# Patient Record
Sex: Female | Born: 1995 | Hispanic: Yes | Marital: Single | Smoking: Never smoker
Health system: Southern US, Community
[De-identification: ages and names within clinical notes are randomized; demographics above are authoritative.]

## PROBLEM LIST (undated history)

## (undated) ENCOUNTER — Inpatient Hospital Stay: Payer: Self-pay

## (undated) DIAGNOSIS — R7303 Prediabetes: Secondary | ICD-10-CM

## (undated) HISTORY — PX: WISDOM TOOTH EXTRACTION: SHX21

---

## 2019-12-28 DIAGNOSIS — U071 COVID-19: Secondary | ICD-10-CM

## 2019-12-28 HISTORY — DX: COVID-19: U07.1

## 2020-05-22 ENCOUNTER — Other Ambulatory Visit: Payer: Self-pay | Admitting: Gastroenterology

## 2020-05-22 DIAGNOSIS — R945 Abnormal results of liver function studies: Secondary | ICD-10-CM

## 2020-05-22 DIAGNOSIS — R7989 Other specified abnormal findings of blood chemistry: Secondary | ICD-10-CM

## 2020-05-24 ENCOUNTER — Other Ambulatory Visit: Payer: Self-pay

## 2020-05-24 ENCOUNTER — Ambulatory Visit
Admission: RE | Admit: 2020-05-24 | Discharge: 2020-05-24 | Disposition: A | Discharge status: Home / Self Care | Payer: Commercial Managed Care - PPO | Source: Ambulatory Visit | Attending: Gastroenterology | Admitting: Gastroenterology

## 2020-05-24 DIAGNOSIS — R7989 Other specified abnormal findings of blood chemistry: Secondary | ICD-10-CM

## 2020-05-24 DIAGNOSIS — R945 Abnormal results of liver function studies: Secondary | ICD-10-CM

## 2020-12-27 DIAGNOSIS — U071 COVID-19: Secondary | ICD-10-CM

## 2020-12-27 HISTORY — DX: COVID-19: U07.1

## 2021-03-25 ENCOUNTER — Encounter (HOSPITAL_COMMUNITY): Payer: Self-pay | Admitting: Orthopedic Surgery

## 2021-03-25 ENCOUNTER — Other Ambulatory Visit: Payer: Self-pay

## 2021-03-25 NOTE — Progress Notes (Signed)
Spoke with pt for pre-op call. Pt denies cardiac history. Pt states she is pre-diabetic. States she does not know what her last A1C was. States she was prescribed Metformin but did not take it. I have requested most recent A1C from Wallie Renshaw, NP office.   Covid test on arrival.

## 2021-03-25 NOTE — H&P (Signed)
Orthopaedic Trauma Service (OTS) Consult   Patient ID: Savannah Hoffman MRN: [id number] DOB/AGE: 1996/01/10 26 y.o.   HPI: Savannah Hoffman is a 26 y.o. female who sustained a L tibial plateau fracture on 02/23/2021 due to a ground level fall. She was initially seen and [facility] and then referred to OTS for definitive management. Pt first seen on 03/06/2021, at that visit her fracture was well aligned and she wished to pursue non-op management.  Upon second follow up visit it was noted that her fracture had shifted and surgery was encouraged. Risks and benefits of surgery reviewed with pt and she wishes to proceed   Past Medical History:  Diagnosis Date   COVID 12/2020   and also 2021 - no symptoms, 2022 was a mild case   Pre-diabetes     Past Surgical History:  Procedure Laterality Date   WISDOM TOOTH EXTRACTION      History reviewed. No pertinent family history.  Social History:  reports that she has never smoked. She has never used smokeless tobacco. She reports that she does not currently use alcohol. She reports that she does not use drugs.  Allergies: No Known Allergies  Medications: I have reviewed the patient's current medications. Current Meds  Medication Sig   acetaminophen (TYLENOL) 500 MG tablet Take 500 mg by mouth every 6 (six) hours as needed.   oxyCODONE-acetaminophen (PERCOCET) 5-325 MG tablet Take 1 tablet by mouth every 6 (six) hours as needed for severe pain.     No results found for this or any previous visit (from the past 48 hour(s)).  No results found.  Intake/Output    None      Review of Systems  Constitutional:  Negative for chills and fever.  Eyes:  Negative for blurred vision.  Respiratory:  Negative for shortness of breath.   Cardiovascular:  Negative for chest pain and palpitations.  Gastrointestinal:  Negative for abdominal pain, nausea and vomiting.  Genitourinary:  Negative for dysuria.  Neurological:  Negative for tingling  and sensory change.  Last menstrual period 02/26/2021. Physical Exam Constitutional:      General: She is not in acute distress.    Appearance: Normal appearance.  HENT:     Head: Normocephalic and atraumatic.  Eyes:     Extraocular Movements: Extraocular movements intact.  Cardiovascular:     Rate and Rhythm: Normal rate.  Pulmonary:     Effort: Pulmonary effort is normal.  Musculoskeletal:     Comments: Left Lower Extremity  No traumatic wounds to left knee or lower leg Ext warm  Minimal swelling  Distal motor and sensory functions intact No DCT Compartments are soft No pain with passive stretch    Neurological:     Mental Status: She is alert.     Assessment/Plan:  26 y/o female s/p fall with left tibial plateau fracture   - fall  -closed left tibial plateau fracture  OR for ORIF   NWB X 6 weeks  Unrestricted ROM L knee in hinged brace  PT/OT postop   Admit post op for pain control and therapies   - Pain management:  Multimodal  - Medical issues   ? Hx of pre-diabetes   Apparently prescribed metformin but not taking    Check a1c  - DVT/PE prophylaxis:  TBD post op    ASA vs DOAC - ID:   Periop abx  - Metabolic Bone Disease:  Check vitamin d   - Activity:  As above  - FEN/GI prophylaxis/Foley/Lines:  Npo   Advance diet post op   - Dispo:  OR for ORIF Left tibial plateau      Mearl Latin, PA-C [phone] (C) 03/25/2021, 1:50 PM  [facility] [address] [place] [address] [phone] [phone] (F)    After 5pm and on the weekends please log on to Amion, go to orthopaedics and the look under the [facility] Call for the provider(s) on call. You can also call our office at [phone] and then follow the prompts to be connected to the call team.

## 2021-03-26 ENCOUNTER — Ambulatory Visit (HOSPITAL_COMMUNITY): Payer: Commercial Managed Care - PPO | Admitting: Anesthesiology

## 2021-03-26 ENCOUNTER — Ambulatory Visit (HOSPITAL_COMMUNITY): Payer: Commercial Managed Care - PPO

## 2021-03-26 ENCOUNTER — Encounter (HOSPITAL_COMMUNITY)
Admission: RE | Disposition: A | Discharge status: Home / Self Care | Payer: Self-pay | Source: Home / Self Care | Attending: Orthopedic Surgery

## 2021-03-26 ENCOUNTER — Inpatient Hospital Stay (HOSPITAL_COMMUNITY): Payer: Commercial Managed Care - PPO

## 2021-03-26 ENCOUNTER — Other Ambulatory Visit: Payer: Self-pay

## 2021-03-26 ENCOUNTER — Inpatient Hospital Stay (HOSPITAL_COMMUNITY)
Admission: RE | Admit: 2021-03-26 | Discharge: 2021-03-27 | DRG: 493 | Disposition: A | Discharge status: Home / Self Care | Payer: Commercial Managed Care - PPO | Attending: Orthopedic Surgery | Admitting: Orthopedic Surgery

## 2021-03-26 ENCOUNTER — Encounter (HOSPITAL_COMMUNITY): Payer: Self-pay | Admitting: Orthopedic Surgery

## 2021-03-26 DIAGNOSIS — D62 Acute posthemorrhagic anemia: Secondary | ICD-10-CM | POA: Diagnosis present

## 2021-03-26 DIAGNOSIS — Z419 Encounter for procedure for purposes other than remedying health state, unspecified: Secondary | ICD-10-CM

## 2021-03-26 DIAGNOSIS — S82142A Displaced bicondylar fracture of left tibia, initial encounter for closed fracture: Secondary | ICD-10-CM

## 2021-03-26 DIAGNOSIS — Z8616 Personal history of COVID-19: Secondary | ICD-10-CM | POA: Diagnosis not present

## 2021-03-26 DIAGNOSIS — S82122A Displaced fracture of lateral condyle of left tibia, initial encounter for closed fracture: Principal | ICD-10-CM

## 2021-03-26 DIAGNOSIS — E559 Vitamin D deficiency, unspecified: Secondary | ICD-10-CM | POA: Diagnosis present

## 2021-03-26 DIAGNOSIS — W1830XA Fall on same level, unspecified, initial encounter: Secondary | ICD-10-CM | POA: Diagnosis present

## 2021-03-26 DIAGNOSIS — T148XXA Other injury of unspecified body region, initial encounter: Secondary | ICD-10-CM

## 2021-03-26 HISTORY — DX: Prediabetes: R73.03

## 2021-03-26 HISTORY — PX: ORIF TIBIA PLATEAU: SHX2132

## 2021-03-26 LAB — COMPREHENSIVE METABOLIC PANEL
ALT: 28 U/L (ref 0–44)
AST: 40 U/L (ref 15–41)
Albumin: 4 g/dL (ref 3.5–5.0)
Alkaline Phosphatase: 49 U/L (ref 38–126)
Anion gap: 8 (ref 5–15)
BUN: 8 mg/dL (ref 6–20)
CO2: 23 mmol/L (ref 22–32)
Calcium: 9.1 mg/dL (ref 8.9–10.3)
Chloride: 107 mmol/L (ref 98–111)
Creatinine, Ser: 0.67 mg/dL (ref 0.44–1.00)
GFR, Estimated: >60 mL/min (ref >60)
Glucose, Bld: 94 mg/dL (ref 70–99)
Potassium: 3.6 mmol/L (ref 3.5–5.1)
Sodium: 138 mmol/L (ref 135–145)
Total Bilirubin: 0.5 mg/dL (ref 0.3–1.2)
Total Protein: 7.9 g/dL (ref 6.5–8.1)

## 2021-03-26 LAB — SARS CORONAVIRUS 2 BY RT PCR (HOSPITAL ORDER, PERFORMED IN ~~LOC~~ HOSPITAL LAB): SARS Coronavirus 2: NEGATIVE

## 2021-03-26 LAB — CBC WITH DIFFERENTIAL/PLATELET
Abs Immature Granulocytes: 0.02 10*3/uL (ref 0.00–0.07)
Basophils Absolute: 0 10*3/uL (ref 0.0–0.1)
Basophils Relative: 1 %
Eosinophils Absolute: 0.1 10*3/uL (ref 0.0–0.5)
Eosinophils Relative: 1 %
HCT: 34.9 % — ABNORMAL LOW (ref 36.0–46.0)
Hemoglobin: 11.5 g/dL — ABNORMAL LOW (ref 12.0–15.0)
Immature Granulocytes: 0 %
Lymphocytes Relative: 35 %
Lymphs Abs: 2.7 10*3/uL (ref 0.7–4.0)
MCH: 27.2 pg (ref 26.0–34.0)
MCHC: 33 g/dL (ref 30.0–36.0)
MCV: 82.5 fL (ref 80.0–100.0)
Monocytes Absolute: 0.6 10*3/uL (ref 0.1–1.0)
Monocytes Relative: 8 %
Neutro Abs: 4.1 10*3/uL (ref 1.7–7.7)
Neutrophils Relative %: 55 %
Platelets: 295 10*3/uL (ref 150–400)
RBC: 4.23 MIL/uL (ref 3.87–5.11)
RDW: 13.8 % (ref 11.5–15.5)
WBC: 7.6 10*3/uL (ref 4.0–10.5)
nRBC: 0 % (ref 0.0–0.2)

## 2021-03-26 LAB — HEMOGLOBIN A1C
Hgb A1c MFr Bld: 5.5 % (ref 4.8–5.6)
Mean Plasma Glucose: 111.15 mg/dL

## 2021-03-26 LAB — URINALYSIS, ROUTINE W REFLEX MICROSCOPIC
Bilirubin Urine: NEGATIVE
Glucose, UA: NEGATIVE mg/dL
Hgb urine dipstick: NEGATIVE
Ketones, ur: NEGATIVE mg/dL
Leukocytes,Ua: NEGATIVE
Nitrite: NEGATIVE
Protein, ur: NEGATIVE mg/dL
Specific Gravity, Urine: 1.029 (ref 1.005–1.030)
pH: 5 (ref 5.0–8.0)

## 2021-03-26 LAB — POCT PREGNANCY, URINE: Preg Test, Ur: NEGATIVE

## 2021-03-26 LAB — VITAMIN D 25 HYDROXY (VIT D DEFICIENCY, FRACTURES): Vit D, 25-Hydroxy: 9.91 ng/mL — ABNORMAL LOW (ref 30–100)

## 2021-03-26 LAB — PROTIME-INR
INR: 0.9 (ref 0.8–1.2)
Prothrombin Time: 12.5 seconds (ref 11.4–15.2)

## 2021-03-26 SURGERY — OPEN REDUCTION INTERNAL FIXATION (ORIF) TIBIAL PLATEAU
Anesthesia: General | Laterality: Left

## 2021-03-26 MED ORDER — OXYCODONE HCL 5 MG/5ML PO SOLN: 5.0000 mg | Freq: Once | ORAL | Status: DC | PRN

## 2021-03-26 MED ORDER — FENTANYL CITRATE (PF) 100 MCG/2ML IJ SOLN
INTRAMUSCULAR | Status: AC
  Filled 2021-03-26: qty 2

## 2021-03-26 MED ORDER — OXYCODONE HCL 5 MG PO TABS
5.0000 mg | ORAL_TABLET | ORAL | Status: DC | PRN
  Administered 2021-03-27: 10 mg via ORAL
  Filled 2021-03-26 (×3): qty 2

## 2021-03-26 MED ORDER — SUGAMMADEX SODIUM 200 MG/2ML IV SOLN
INTRAVENOUS | Status: DC | PRN
  Administered 2021-03-26: 200 mg via INTRAVENOUS

## 2021-03-26 MED ORDER — PROPOFOL 10 MG/ML IV BOLUS
INTRAVENOUS | Status: DC | PRN
  Administered 2021-03-26: 120 mg via INTRAVENOUS

## 2021-03-26 MED ORDER — HYDROMORPHONE HCL 1 MG/ML IJ SOLN
0.5000 mg | INTRAMUSCULAR | Status: DC | PRN
  Administered 2021-03-26: 1 mg via INTRAVENOUS
  Filled 2021-03-26 (×2): qty 1

## 2021-03-26 MED ORDER — ONDANSETRON HCL 4 MG PO TABS: 4.0000 mg | ORAL_TABLET | Freq: Four times a day (QID) | ORAL | Status: DC | PRN

## 2021-03-26 MED ORDER — FENTANYL CITRATE (PF) 250 MCG/5ML IJ SOLN
INTRAMUSCULAR | Status: AC
  Filled 2021-03-26: qty 5

## 2021-03-26 MED ORDER — SENNOSIDES-DOCUSATE SODIUM 8.6-50 MG PO TABS: 1.0000 | ORAL_TABLET | Freq: Every evening | ORAL | Status: DC | PRN

## 2021-03-26 MED ORDER — GABAPENTIN 300 MG PO CAPS
300.0000 mg | ORAL_CAPSULE | Freq: Once | ORAL | Status: AC
  Administered 2021-03-26: 300 mg via ORAL
  Filled 2021-03-26: qty 1

## 2021-03-26 MED ORDER — METOCLOPRAMIDE HCL 5 MG PO TABS: 5.0000 mg | ORAL_TABLET | Freq: Three times a day (TID) | ORAL | Status: DC | PRN

## 2021-03-26 MED ORDER — ENOXAPARIN SODIUM 40 MG/0.4ML IJ SOSY
40.0000 mg | PREFILLED_SYRINGE | INTRAMUSCULAR | Status: DC
  Administered 2021-03-27: 40 mg via SUBCUTANEOUS
  Filled 2021-03-26: qty 0.4

## 2021-03-26 MED ORDER — VANCOMYCIN HCL 1000 MG IV SOLR
INTRAVENOUS | Status: AC
  Filled 2021-03-26: qty 20

## 2021-03-26 MED ORDER — BISACODYL 5 MG PO TBEC: 5.0000 mg | DELAYED_RELEASE_TABLET | Freq: Every day | ORAL | Status: DC | PRN

## 2021-03-26 MED ORDER — DEXAMETHASONE SODIUM PHOSPHATE 10 MG/ML IJ SOLN
INTRAMUSCULAR | Status: AC
  Filled 2021-03-26: qty 1

## 2021-03-26 MED ORDER — POTASSIUM CHLORIDE IN NACL 20-0.9 MEQ/L-% IV SOLN
INTRAVENOUS | Status: DC
  Filled 2021-03-26: qty 1000

## 2021-03-26 MED ORDER — ONDANSETRON HCL 4 MG/2ML IJ SOLN: 4.0000 mg | Freq: Four times a day (QID) | INTRAMUSCULAR | Status: DC | PRN

## 2021-03-26 MED ORDER — ACETAMINOPHEN 500 MG PO TABS: 1000.0000 mg | ORAL_TABLET | Freq: Once | ORAL | Status: DC | PRN

## 2021-03-26 MED ORDER — ACETAMINOPHEN 160 MG/5ML PO SOLN: 1000.0000 mg | Freq: Once | ORAL | Status: DC | PRN

## 2021-03-26 MED ORDER — METHOCARBAMOL 750 MG PO TABS
750.0000 mg | ORAL_TABLET | Freq: Four times a day (QID) | ORAL | Status: DC
Start: 2021-03-26 — End: 2021-03-27
  Administered 2021-03-26 – 2021-03-27 (×4): 750 mg via ORAL
  Filled 2021-03-26 (×4): qty 1

## 2021-03-26 MED ORDER — OXYCODONE HCL 5 MG PO TABS: 5.0000 mg | ORAL_TABLET | Freq: Once | ORAL | Status: DC | PRN

## 2021-03-26 MED ORDER — DEXAMETHASONE SODIUM PHOSPHATE 10 MG/ML IJ SOLN
INTRAMUSCULAR | Status: DC | PRN
  Administered 2021-03-26: 10 mg via INTRAVENOUS

## 2021-03-26 MED ORDER — LACTATED RINGERS IV SOLN: INTRAVENOUS | Status: DC

## 2021-03-26 MED ORDER — MIDAZOLAM HCL 2 MG/2ML IJ SOLN
INTRAMUSCULAR | Status: AC
  Filled 2021-03-26: qty 2

## 2021-03-26 MED ORDER — CHLORHEXIDINE GLUCONATE 0.12 % MT SOLN
15.0000 mL | Freq: Once | OROMUCOSAL | Status: AC
  Administered 2021-03-26: 15 mL via OROMUCOSAL
  Filled 2021-03-26: qty 15

## 2021-03-26 MED ORDER — ACETAMINOPHEN 10 MG/ML IV SOLN: 1000.0000 mg | Freq: Once | INTRAVENOUS | Status: DC | PRN

## 2021-03-26 MED ORDER — METHOCARBAMOL 1000 MG/10ML IJ SOLN
500.0000 mg | Freq: Four times a day (QID) | INTRAVENOUS | Status: DC | PRN
  Filled 2021-03-26: qty 5

## 2021-03-26 MED ORDER — CEFAZOLIN SODIUM-DEXTROSE 2-4 GM/100ML-% IV SOLN
2.0000 g | INTRAVENOUS | Status: AC
  Administered 2021-03-26: 2 g via INTRAVENOUS
  Filled 2021-03-26: qty 100

## 2021-03-26 MED ORDER — ACETAMINOPHEN 10 MG/ML IV SOLN
INTRAVENOUS | Status: DC | PRN
  Administered 2021-03-26: 1000 mg via INTRAVENOUS

## 2021-03-26 MED ORDER — METHOCARBAMOL 1000 MG/10ML IJ SOLN: 500.0000 mg | Freq: Four times a day (QID) | INTRAVENOUS | Status: DC

## 2021-03-26 MED ORDER — ACETAMINOPHEN 500 MG PO TABS
1000.0000 mg | ORAL_TABLET | Freq: Three times a day (TID) | ORAL | Status: DC
  Administered 2021-03-26 – 2021-03-27 (×2): 1000 mg via ORAL
  Filled 2021-03-26 (×3): qty 2

## 2021-03-26 MED ORDER — DEXMEDETOMIDINE (PRECEDEX) IN NS 20 MCG/5ML (4 MCG/ML) IV SYRINGE
PREFILLED_SYRINGE | INTRAVENOUS | Status: AC
  Filled 2021-03-26: qty 5

## 2021-03-26 MED ORDER — ORAL CARE MOUTH RINSE: 15.0000 mL | Freq: Once | OROMUCOSAL | Status: AC

## 2021-03-26 MED ORDER — ONDANSETRON HCL 4 MG/2ML IJ SOLN
INTRAMUSCULAR | Status: DC | PRN
  Administered 2021-03-26: 4 mg via INTRAVENOUS

## 2021-03-26 MED ORDER — ONDANSETRON HCL 4 MG/2ML IJ SOLN
INTRAMUSCULAR | Status: AC
  Filled 2021-03-26: qty 2

## 2021-03-26 MED ORDER — 0.9 % SODIUM CHLORIDE (POUR BTL) OPTIME
TOPICAL | Status: DC | PRN
  Administered 2021-03-26: 1000 mL

## 2021-03-26 MED ORDER — PROPOFOL 10 MG/ML IV BOLUS
INTRAVENOUS | Status: AC
  Filled 2021-03-26: qty 20

## 2021-03-26 MED ORDER — LIDOCAINE 2% (20 MG/ML) 5 ML SYRINGE
INTRAMUSCULAR | Status: DC | PRN
  Administered 2021-03-26: 60 mg via INTRAVENOUS

## 2021-03-26 MED ORDER — METOCLOPRAMIDE HCL 5 MG/ML IJ SOLN: 5.0000 mg | Freq: Three times a day (TID) | INTRAMUSCULAR | Status: DC | PRN

## 2021-03-26 MED ORDER — ROCURONIUM BROMIDE 10 MG/ML (PF) SYRINGE
PREFILLED_SYRINGE | INTRAVENOUS | Status: DC | PRN
  Administered 2021-03-26: 30 mg via INTRAVENOUS
  Administered 2021-03-26: 70 mg via INTRAVENOUS

## 2021-03-26 MED ORDER — MELOXICAM 7.5 MG PO TABS
15.0000 mg | ORAL_TABLET | Freq: Once | ORAL | Status: AC
  Administered 2021-03-26: 15 mg via ORAL
  Filled 2021-03-26: qty 2

## 2021-03-26 MED ORDER — DOCUSATE SODIUM 100 MG PO CAPS
100.0000 mg | ORAL_CAPSULE | Freq: Two times a day (BID) | ORAL | Status: DC
  Administered 2021-03-27: 100 mg via ORAL
  Filled 2021-03-26 (×2): qty 1

## 2021-03-26 MED ORDER — FENTANYL CITRATE (PF) 100 MCG/2ML IJ SOLN
25.0000 ug | INTRAMUSCULAR | Status: DC | PRN
  Administered 2021-03-26 (×3): 50 ug via INTRAVENOUS

## 2021-03-26 MED ORDER — DEXMEDETOMIDINE (PRECEDEX) IN NS 20 MCG/5ML (4 MCG/ML) IV SYRINGE
PREFILLED_SYRINGE | INTRAVENOUS | Status: DC | PRN
  Administered 2021-03-26 (×3): 4 ug via INTRAVENOUS

## 2021-03-26 MED ORDER — ROCURONIUM BROMIDE 10 MG/ML (PF) SYRINGE
PREFILLED_SYRINGE | INTRAVENOUS | Status: AC
  Filled 2021-03-26: qty 10

## 2021-03-26 MED ORDER — CEFAZOLIN SODIUM-DEXTROSE 1-4 GM/50ML-% IV SOLN
1.0000 g | Freq: Four times a day (QID) | INTRAVENOUS | Status: AC
  Administered 2021-03-26 – 2021-03-27 (×3): 1 g via INTRAVENOUS
  Filled 2021-03-26 (×3): qty 50

## 2021-03-26 MED ORDER — OXYCODONE HCL 5 MG PO TABS
10.0000 mg | ORAL_TABLET | ORAL | Status: DC | PRN
  Administered 2021-03-26 – 2021-03-27 (×3): 10 mg via ORAL
  Filled 2021-03-26: qty 2

## 2021-03-26 MED ORDER — FENTANYL CITRATE (PF) 250 MCG/5ML IJ SOLN
INTRAMUSCULAR | Status: DC | PRN
  Administered 2021-03-26: 50 ug via INTRAVENOUS
  Administered 2021-03-26 (×3): 25 ug via INTRAVENOUS
  Administered 2021-03-26: 100 ug via INTRAVENOUS

## 2021-03-26 MED ORDER — LIDOCAINE 2% (20 MG/ML) 5 ML SYRINGE
INTRAMUSCULAR | Status: AC
  Filled 2021-03-26: qty 5

## 2021-03-26 MED ORDER — ACETAMINOPHEN 325 MG PO TABS: 325.0000 mg | ORAL_TABLET | Freq: Four times a day (QID) | ORAL | Status: DC | PRN

## 2021-03-26 MED ORDER — ACETAMINOPHEN 10 MG/ML IV SOLN
INTRAVENOUS | Status: AC
  Filled 2021-03-26: qty 100

## 2021-03-26 MED ORDER — MIDAZOLAM HCL 2 MG/2ML IJ SOLN
INTRAMUSCULAR | Status: DC | PRN
Start: 2021-03-26 — End: 2021-03-26
  Administered 2021-03-26: 2 mg via INTRAVENOUS

## 2021-03-26 SURGICAL SUPPLY — 80 items
BAG COUNTER SPONGE SURGICOUNT (BAG) ×2 IMPLANT
BIT DRILL CAL (BIT) IMPLANT
BLADE CLIPPER SURG (BLADE) IMPLANT
BLADE SURG 10 STRL SS (BLADE) ×2 IMPLANT
BLADE SURG 15 STRL LF DISP TIS (BLADE) ×1 IMPLANT
BLADE SURG 15 STRL SS (BLADE) ×1
BNDG COHESIVE 4X5 TAN STRL (GAUZE/BANDAGES/DRESSINGS) ×2 IMPLANT
BNDG ELASTIC 4X5.8 VLCR STR LF (GAUZE/BANDAGES/DRESSINGS) ×1 IMPLANT
BNDG ELASTIC 6X5.8 VLCR STR LF (GAUZE/BANDAGES/DRESSINGS) ×1 IMPLANT
BNDG GAUZE ELAST 4 BULKY (GAUZE/BANDAGES/DRESSINGS) ×1 IMPLANT
BONE CANC CHIPS 20CC PCAN1/4 (Bone Implant) ×2 IMPLANT
BRUSH SCRUB EZ PLAIN DRY (MISCELLANEOUS) ×4 IMPLANT
CANISTER SUCT 3000ML PPV (MISCELLANEOUS) ×2 IMPLANT
CANISTER WOUND CARE 500ML ATS (WOUND CARE) IMPLANT
CHIPS CANC BONE 20CC PCAN1/4 (Bone Implant) ×1 IMPLANT
COVER SURGICAL LIGHT HANDLE (MISCELLANEOUS) ×4 IMPLANT
DRAPE C-ARM 42X72 X-RAY (DRAPES) ×2 IMPLANT
DRAPE C-ARMOR (DRAPES) ×2 IMPLANT
DRAPE HALF SHEET 40X57 (DRAPES) IMPLANT
DRAPE INCISE IOBAN 66X45 STRL (DRAPES) ×2 IMPLANT
DRAPE ORTHO SPLIT 77X108 STRL (DRAPES) ×2
DRAPE SURG ORHT 6 SPLT 77X108 (DRAPES) ×2 IMPLANT
DRAPE U-SHAPE 47X51 STRL (DRAPES) ×2 IMPLANT
DRILL BIT CAL (BIT) ×2
DRSG ADAPTIC 3X8 NADH LF (GAUZE/BANDAGES/DRESSINGS) ×1 IMPLANT
ELECT REM PT RETURN 9FT ADLT (ELECTROSURGICAL) ×2
ELECTRODE REM PT RTRN 9FT ADLT (ELECTROSURGICAL) ×1 IMPLANT
GAUZE SPONGE 4X4 12PLY STRL (GAUZE/BANDAGES/DRESSINGS) ×1 IMPLANT
GLOVE SRG 8 PF TXTR STRL LF DI (GLOVE) ×1 IMPLANT
GLOVE SURG ENC MOIS LTX SZ8 (GLOVE) ×2 IMPLANT
GLOVE SURG ORTHO LTX SZ7.5 (GLOVE) ×4 IMPLANT
GLOVE SURG UNDER POLY LF SZ7.5 (GLOVE) ×2 IMPLANT
GLOVE SURG UNDER POLY LF SZ8 (GLOVE) ×1
GOWN STRL REUS W/ TWL LRG LVL3 (GOWN DISPOSABLE) ×2 IMPLANT
GOWN STRL REUS W/ TWL XL LVL3 (GOWN DISPOSABLE) ×1 IMPLANT
GOWN STRL REUS W/TWL LRG LVL3 (GOWN DISPOSABLE) ×2
GOWN STRL REUS W/TWL XL LVL3 (GOWN DISPOSABLE) ×1
GRAFT BNE CANC CHIPS 1-8 20CC (Bone Implant) IMPLANT
K-WIRE ACE 1.6X6 (WIRE) ×16
KIT BASIN OR (CUSTOM PROCEDURE TRAY) ×2 IMPLANT
KIT TURNOVER KIT B (KITS) ×2 IMPLANT
KWIRE ACE 1.6X6 (WIRE) IMPLANT
MANIFOLD NEPTUNE II (INSTRUMENTS) ×2 IMPLANT
NDL SUT 6 .5 CRC .975X.05 MAYO (NEEDLE) IMPLANT
NEEDLE MAYO TAPER (NEEDLE) ×1
NS IRRIG 1000ML POUR BTL (IV SOLUTION) ×2 IMPLANT
PACK ORTHO EXTREMITY (CUSTOM PROCEDURE TRAY) ×2 IMPLANT
PAD ARMBOARD 7.5X6 YLW CONV (MISCELLANEOUS) ×4 IMPLANT
PAD CAST 4YDX4 CTTN HI CHSV (CAST SUPPLIES) IMPLANT
PADDING CAST COTTON 4X4 STRL (CAST SUPPLIES) ×1
PADDING CAST COTTON 6X4 STRL (CAST SUPPLIES) ×1 IMPLANT
PLATE LOCK 5H STD LT PROX TIB (Plate) ×1 IMPLANT
SCREW CORTICAL LOW PROF 3.5X32 (Screw) ×1 IMPLANT
SCREW LOCK CORT STAR 3.5X65 (Screw) ×3 IMPLANT
SCREW LOCK CORT STAR 3.5X70 (Screw) ×2 IMPLANT
SCREW LOW PROFILE 3.5X30MM TIS (Screw) ×1 IMPLANT
SCREW T15 LP CORT 3.5X40MM NS (Screw) ×1 IMPLANT
SCREW T15 LP CORT 3.5X50MM NS (Screw) ×1 IMPLANT
SCREW T15 LP CORT 3.5X70MM NS (Screw) ×1 IMPLANT
SCREW T15 MD 3.5X65MM NS (Screw) ×1 IMPLANT
SET MONITOR QUICK PRESSURE (MISCELLANEOUS) IMPLANT
SPONGE T-LAP 18X18 ~~LOC~~+RFID (SPONGE) ×2 IMPLANT
STAPLER VISISTAT 35W (STAPLE) ×2 IMPLANT
STOCKINETTE IMPERVIOUS LG (DRAPES) ×2 IMPLANT
SUCTION FRAZIER HANDLE 10FR (MISCELLANEOUS) ×1
SUCTION TUBE FRAZIER 10FR DISP (MISCELLANEOUS) ×1 IMPLANT
SUT ETHILON 2 0 FS 18 (SUTURE) ×1 IMPLANT
SUT PROLENE 0 CT 2 (SUTURE) ×6 IMPLANT
SUT VIC AB 0 CT1 27 (SUTURE) ×1
SUT VIC AB 0 CT1 27XBRD ANBCTR (SUTURE) ×1 IMPLANT
SUT VIC AB 1 CT1 27 (SUTURE)
SUT VIC AB 1 CT1 27XBRD ANBCTR (SUTURE) ×1 IMPLANT
SUT VIC AB 1 CTX 36 (SUTURE) ×1
SUT VIC AB 1 CTX36XBRD ANBCTRL (SUTURE) IMPLANT
SUT VIC AB 2-0 CT1 27 (SUTURE) ×1
SUT VIC AB 2-0 CT1 TAPERPNT 27 (SUTURE) ×2 IMPLANT
TOWEL GREEN STERILE (TOWEL DISPOSABLE) ×4 IMPLANT
TOWEL GREEN STERILE FF (TOWEL DISPOSABLE) ×2 IMPLANT
TUBE CONNECTING 12X1/4 (SUCTIONS) ×2 IMPLANT
YANKAUER SUCT BULB TIP NO VENT (SUCTIONS) ×2 IMPLANT

## 2021-03-26 NOTE — Plan of Care (Signed)

## 2021-03-26 NOTE — Op Note (Signed)
NAMEJaonna Hoffman MEDICAL RECORD [id number] DATE OF BIRTH: 1995/03/24 FACILITY: [facility] LOCATION: [facility] PHYSICIAN:Adina Puzzo H. Jane Broughton, MD  OPERATIVE REPORT  DATE OF PROCEDURE:  03/26/2021  PREOPERATIVE DIAGNOSIS:  LEFT DEPRESSED LATERAL TIBIAL PLATEAU FRACTURE.   POSTOPERATIVE DIAGNOSES:   1.  LEFT DEPRESSED LATERAL TIBIAL PLATEAU FRACTURE.   2.  INTACT LATERAL MENISCUS.  PROCEDURES: 1.  Open reduction internal fixation of left lateral tibial plateau. 2.  Anterior compartment fasciotomy. 3.  Application of stress under fluoroscopy.  SURGEON:  Myrene Galas, MD  ASSISTANT:  PA Student.  ANESTHESIA:  General.  COMPLICATIONS:  None.  TOURNIQUET:  None.  ESTIMATED BLOOD LOSS:  100 mL.  SPECIMENS:  None.  DRAINS:  None.  DISPOSITION:  To PACU.  CONDITION:  Stable.  BRIEF SUMMARY AND INDICATIONS FOR PROCEDURE:  The patient is a very pleasant 26 y.o. who sustained tibial plateau fracture in fall resulting in swelling, pain, inability to bear weight. Although initial films and CT suggested she may be able to be successfully treated without surgery, subsequent x-rays and examination clearly demonstrated clinical instability in full extension. Consequently, I discussed with the patient risks and benefits of surgical repair, including the possibility of infection, nerve injury, vessel injury, DVT, PE, particularly given his medical history, as well as malunion, nonunion, symptomatic hardware, heart attack, stroke and other  complications.  After acknowledging these risks, the patient provided consent to proceed.  BRIEF SUMMARY OF PROCEDURE:  The patient was taken to the operating room where general anesthesia was induced.  The operative lower extremity was prepped and draped in the usual sterile fashion with chlorhexidine wash, then Betadine scrub and paint.  I made a curvilinear incision over Gerdy's tubercle.  The retinaculum was incised proximal to the joint and then the coronary  ligament incised along its base and the knee swung into varus to open up the lateral compartment for visibility. The lateral meniscus was found to be intact and the joint surface markedly depressed but smooth. Prolene sutures were passed in vertical mattress technique through the retinaculum and the edge of the coronary ligament. I initially tried four k wires in concert with varus but was unable to achieve sufficient correction.  I then went to the metaphysis making a trap door using the curved 1/2-inch osteotome with a posteriorly based hinge.  This was opened up and the bone tamp used in sequential fashion supplemented with both fluoroscopy and direct visualization to get the entirety of the joint elevated near to appropriate height.  The Biomet ALPS plate was applied laterally.  Standard screws were used initially in the top level in the most anterior and posterior holes and then lock fixation was used for the remainder.  One distal standard screw was placed at the distal edge of the plate prior to beginning placement of any of the locked fixation.  AP and lateral views showed outstanding reduction and overall knee alignment.  It should be noted that prior to closing the trap door 20 mL of cancellous chips was impacted into this area below the subchondral bone, which had been elevated into a reduced position before putting down the lateral plate.  Stress evaluation under fluoro showed no instability or opening in full extension. Lastly, the long Metzenbaum scissors were used to spread superficial and deep to the anterior compartment fascia and then the scissors were passed 10 cm along the fascia to perform an anterior compartment release to reduce the risk of postoperative compartment syndrome or other complications.  All wounds were  irrigated thoroughly and then closed in standard layered fashion using 0 Prolene vertical mattress for coronary multi-ligament repair, #1 Vicryl for the retinaculum, 2-0 Vicryl and  2-0 nylon for the subcutaneous and skin.  Sterile gently compressive dressing was applied which were be supplemented by her own hinged knee brace post-op.  The patient was awakened from anesthesia and transported to the PACU in stable condition.  A PA student was present and assisting throughout.  Assistant was absolutely necessary to control the leg for inspection, as well as reduction and provisional and definitive fixation of the plateau.   PROGNOSIS:  The patient will have unrestricted range of motion, but will be strictly nonweightbearing for the next 6 weeks with graduated weightbearing thereafter.  She will be on Lovenox for DVT prophylaxis.  Ice, elevate, contact us immediately with any concerns. Return to the office in 1-2 weeks for removal of sutures.

## 2021-03-26 NOTE — Anesthesia Procedure Notes (Signed)
Procedure Name: Intubation Date/Time: 03/26/2021 8:39 AM Performed by: Lorie Phenix, CRNA Pre-anesthesia Checklist: Patient identified, Emergency Drugs available, Suction available and Patient being monitored Patient Re-evaluated:Patient Re-evaluated prior to induction Oxygen Delivery Method: Circle system utilized Preoxygenation: Pre-oxygenation with 100% oxygen Induction Type: IV induction Ventilation: Mask ventilation without difficulty Laryngoscope Size: Mac and 3 Grade View: Grade I Tube type: Oral Tube size: 7.0 mm Number of attempts: 1 Airway Equipment and Method: Stylet Placement Confirmation: ETT inserted through vocal cords under direct vision, positive ETCO2 and breath sounds checked- equal and bilateral Secured at: 22 cm Tube secured with: Tape Dental Injury: Teeth and Oropharynx as per pre-operative assessment

## 2021-03-26 NOTE — Anesthesia Preprocedure Evaluation (Signed)
Anesthesia Evaluation  Patient identified by MRN, date of birth, ID band Patient awake    Reviewed: Allergy & Precautions, NPO status , Patient's Chart, lab work & pertinent test results  History of Anesthesia Complications Negative for: history of anesthetic complications  Airway Mallampati: II  TM Distance: >3 FB Neck ROM: Full    Dental  (+) Dental Advisory Given, Teeth Intact   Pulmonary neg pulmonary ROS,    breath sounds clear to auscultation       Cardiovascular negative cardio ROS   Rhythm:Regular     Neuro/Psych negative neurological ROS  negative psych ROS   GI/Hepatic negative GI ROS, Neg liver ROS,   Endo/Other  negative endocrine ROS  Renal/GU negative Renal ROS     Musculoskeletal LEFT TIBIAL PLATEAU FRACTURE   Abdominal   Peds  Hematology negative hematology ROS (+)   Anesthesia Other Findings   Reproductive/Obstetrics Lab Results      Component                Value               Date                      PREGTESTUR               NEGATIVE            03/26/2021                                        Anesthesia Physical Anesthesia Plan  ASA: 1  Anesthesia Plan: General   Post-op Pain Management: Toradol IV (intra-op)* and Ofirmev IV (intra-op)*   Induction: Intravenous  PONV Risk Score and Plan: 3 and Ondansetron and Dexamethasone  Airway Management Planned: Oral ETT  Additional Equipment: None  Intra-op Plan:   Post-operative Plan: Extubation in OR  Informed Consent: I have reviewed the patients History and Physical, chart, labs and discussed the procedure including the risks, benefits and alternatives for the proposed anesthesia with the patient or authorized representative who has indicated his/her understanding and acceptance.     Dental advisory given  Plan Discussed with: CRNA  Anesthesia Plan Comments:         Anesthesia Quick  Evaluation

## 2021-03-26 NOTE — Transfer of Care (Signed)
Immediate Anesthesia Transfer of Care Note  Patient: [name]  Procedure(s) Performed: OPEN REDUCTION INTERNAL FIXATION (ORIF) TIBIAL PLATEAU (Left)  Patient Location: PACU  Anesthesia Type:General  Level of Consciousness: drowsy  Airway & Oxygen Therapy: Patient Spontanous Breathing and Patient connected to face mask oxygen  Post-op Assessment: Report given to RN and Post -op Vital signs reviewed and stable  Post vital signs: Reviewed and stable  Last Vitals:  Vitals Value Taken Time  BP 94/56 03/26/21 1113  Temp    Pulse 91 03/26/21 1117  Resp 13 03/26/21 1117  SpO2 94 % 03/26/21 1117  Vitals shown include unvalidated device data.  Last Pain:  Vitals:   03/26/21 0703  TempSrc:   PainSc: 0-No pain         Complications: No notable events documented.

## 2021-03-27 ENCOUNTER — Other Ambulatory Visit (HOSPITAL_COMMUNITY): Payer: Self-pay

## 2021-03-27 ENCOUNTER — Encounter (HOSPITAL_COMMUNITY): Payer: Self-pay | Admitting: Orthopedic Surgery

## 2021-03-27 DIAGNOSIS — E559 Vitamin D deficiency, unspecified: Secondary | ICD-10-CM | POA: Diagnosis present

## 2021-03-27 HISTORY — DX: Vitamin D deficiency, unspecified: E55.9

## 2021-03-27 LAB — CBC
HCT: 27.8 % — ABNORMAL LOW (ref 36.0–46.0)
Hemoglobin: 8.8 g/dL — ABNORMAL LOW (ref 12.0–15.0)
MCH: 26.5 pg (ref 26.0–34.0)
MCHC: 31.7 g/dL (ref 30.0–36.0)
MCV: 83.7 fL (ref 80.0–100.0)
Platelets: 255 10*3/uL (ref 150–400)
RBC: 3.32 MIL/uL — ABNORMAL LOW (ref 3.87–5.11)
RDW: 13.5 % (ref 11.5–15.5)
WBC: 12.2 10*3/uL — ABNORMAL HIGH (ref 4.0–10.5)
nRBC: 0 % (ref 0.0–0.2)

## 2021-03-27 MED ORDER — ZINC SULFATE 220 (50 ZN) MG PO TABS
220.0000 mg | ORAL_TABLET | Freq: Every day | ORAL | 1 refills | Status: DC
Start: 2021-03-27 — End: 2023-09-19
  Filled 2021-03-27: qty 30, 30d supply, fill #0

## 2021-03-27 MED ORDER — ASCORBIC ACID 1000 MG PO TABS
1000.0000 mg | ORAL_TABLET | Freq: Every day | ORAL | 1 refills | Status: DC
  Filled 2021-03-27: qty 30, 30d supply, fill #0

## 2021-03-27 MED ORDER — ASCORBIC ACID 500 MG PO TABS
1000.0000 mg | ORAL_TABLET | Freq: Every day | ORAL | Status: DC
  Administered 2021-03-27: 1000 mg via ORAL
  Filled 2021-03-27: qty 2

## 2021-03-27 MED ORDER — OXYCODONE-ACETAMINOPHEN 5-325 MG PO TABS
1.0000 | ORAL_TABLET | Freq: Four times a day (QID) | ORAL | 0 refills | Status: DC | PRN
  Filled 2021-03-27: qty 50, 7d supply, fill #0

## 2021-03-27 MED ORDER — ACETAMINOPHEN 500 MG PO TABS
500.0000 mg | ORAL_TABLET | Freq: Two times a day (BID) | ORAL | 0 refills | Status: DC
  Filled 2021-03-27: qty 60, 30d supply, fill #0

## 2021-03-27 MED ORDER — VITAMIN D (ERGOCALCIFEROL) 1.25 MG (50000 UNIT) PO CAPS
50000.0000 [IU] | ORAL_CAPSULE | ORAL | Status: DC
  Administered 2021-03-27: 50000 [IU] via ORAL
  Filled 2021-03-27 (×2): qty 1

## 2021-03-27 MED ORDER — VITAMIN D 25 MCG (1000 UNIT) PO TABS
2000.0000 [IU] | ORAL_TABLET | Freq: Two times a day (BID) | ORAL | Status: DC
  Administered 2021-03-27: 2000 [IU] via ORAL
  Filled 2021-03-27: qty 2

## 2021-03-27 MED ORDER — METHOCARBAMOL 500 MG PO TABS
500.0000 mg | ORAL_TABLET | Freq: Four times a day (QID) | ORAL | 0 refills | Status: DC | PRN
  Filled 2021-03-27: qty 60, 8d supply, fill #0

## 2021-03-27 MED ORDER — ZINC SULFATE 220 (50 ZN) MG PO CAPS
220.0000 mg | ORAL_CAPSULE | Freq: Every day | ORAL | Status: DC
  Administered 2021-03-27: 220 mg via ORAL
  Filled 2021-03-27: qty 1

## 2021-03-27 MED ORDER — DOCUSATE SODIUM 100 MG PO CAPS
100.0000 mg | ORAL_CAPSULE | Freq: Two times a day (BID) | ORAL | 0 refills | Status: DC
Start: 2021-03-27 — End: 2023-09-19
  Filled 2021-03-27: qty 30, 15d supply, fill #0

## 2021-03-27 MED ORDER — RIVAROXABAN 15 MG PO TABS
15.0000 mg | ORAL_TABLET | Freq: Every day | ORAL | 0 refills | Status: DC
  Filled 2021-03-27: qty 30, 30d supply, fill #0

## 2021-03-27 MED ORDER — VITAMIN D (ERGOCALCIFEROL) 1.25 MG (50000 UNIT) PO CAPS
50000.0000 [IU] | ORAL_CAPSULE | ORAL | 1 refills | Status: DC
  Filled 2021-03-27: qty 5, 35d supply, fill #0

## 2021-03-27 MED ORDER — VITAMIN D 50 MCG (2000 UT) PO TABS
5000.0000 [IU] | ORAL_TABLET | Freq: Every day | ORAL | 6 refills | Status: DC
  Filled 2021-03-27: qty 75, 30d supply, fill #0

## 2021-03-27 NOTE — Progress Notes (Signed)
? ?                              Orthopaedic Trauma Service Progress Note ? ?Patient ID: ?Savannah Hoffman ?MRN: [id number] ?DOB/AGE: 1995-12-10 26 y.o. ? ?Subjective: ? ?Doing very well ?Pain is controlled ?Wants to go home today ?Tolerating diet ?No other complaints or concerns ? ?ROS ?As above ?Objective:  ? ?VITALS:   ?Vitals:  ? 03/26/21 1436 03/26/21 1700 03/26/21 2152 03/27/21 0722  ?BP: 131/86 122/80  99/66  ?Pulse: 90 (!) 102 (!) 104 86  ?Resp: 16 16  14   ?Temp: 98.1 ?F (36.7 ?C) 99.2 ?F (37.3 ?C) 98.7 ?F (37.1 ?C) 98.3 ?F (36.8 ?C)  ?TempSrc: Oral Oral Oral Oral  ?SpO2: 97% 94% 98% 98%  ?Weight:      ?Height:      ? ? ?Estimated body mass index is 30.23 kg/m? as calculated from the following: ?  Height as of this encounter: 5\' 1"  (1.549 m). ?  Weight as of this encounter: 72.6 kg. ? ? ?Intake/Output   ?   02/28 0701 ?03/01 0700 03/01 0701 ?03/02 0700  ? I.V. (mL/kg) 1005.7 (13.9)   ? IV Piggyback 50   ? Total Intake(mL/kg) 1055.7 (14.5)   ? Urine (mL/kg/hr) 500 (0.3)   ? Blood 100   ? Total Output 600   ? Net +455.7   ?     ? Urine Occurrence 2 x   ?  ? ?LABS ? ?Results for orders placed or performed during the hospital encounter of 03/26/21 (from the past 24 hour(s))  ?CBC     Status: Abnormal  ? Collection Time: 03/27/21  6:11 AM  ?Result Value Ref Range  ? WBC 12.2 (H) 4.0 - 10.5 K/uL  ? RBC 3.32 (L) 3.87 - 5.11 MIL/uL  ? Hemoglobin 8.8 (L) 12.0 - 15.0 g/dL  ? HCT 27.8 (L) 36.0 - 46.0 %  ? MCV 83.7 80.0 - 100.0 fL  ? MCH 26.5 26.0 - 34.0 pg  ? MCHC 31.7 30.0 - 36.0 g/dL  ? RDW 13.5 11.5 - 15.5 %  ? Platelets 255 150 - 400 K/uL  ? nRBC 0.0 0.0 - 0.2 %  ? ? ? ?PHYSICAL EXAM:  ? ?Gen: resting comfortably in bed, NAD, appears well ?Lungs: unlabored  ?Cardiac: reg ?Abd: soft, NTND, + BS ?Ext:  ?     Left Lower extremity  ? Dressing clean and dry  ? Knee resting in flexion  ? Brace is currently not on  ?  Brace was locked, I unlocked brace myself ? Extremity is  warm ? Palpable DP pulse ? Compartments are soft and nontender ? No pain out of proportion with passive stretching of toes or ankle ? DPN, SPN, TN sensory functions intact ? EHL, FHL, lesser toe motor function intact ? Ankle flexion, extension, inversion and eversion intact ? ? ?Assessment/Plan: ?1 Day Post-Op  ? ?Principal Problem: ?  Closed bicondylar fracture of left tibial plateau ?Active Problems: ?  Vitamin D deficiency ? ? ?Anti-infectives (From admission, onward)  ? ? Start     Dose/Rate Route Frequency Ordered Stop  ? 03/26/21 1600  ceFAZolin (ANCEF) IVPB 1 g/50 mL premix       ? 1 g ?100 mL/hr over 30 Minutes Intravenous Every 6 hours 03/26/21 1421 03/27/21 0600  ? 03/26/21 0615  ceFAZolin (ANCEF) IVPB 2g/100 mL premix       ? 2 g ?  200 mL/hr over 30 Minutes Intravenous On call to O.R. 03/26/21 1610 03/26/21 0842  ? ?  ?. ? ?POD/HD#: 64 ? ?26 year old female ground-level fall with left bicondylar tibial plateau fracture ? ?-Left bicondylar tibial plateau fracture s/p ORIF ? NWB Left leg x 6-8 weeks, crutches or walker to mobilize ? Unrestricted ROM L knee  ?  Brace can be off for ROM exercises but needs to be on when mobilizing ? Therapy evals ? Dressing changes starting on 03/29/2021 ?  Reviewed with pt  ? ? Ice and elevate  ? Convert to TED hose once dressing changed ?  RN to get hose for pt  ? ? PT- please teach HEP for R knee ROM- AROM, PROM. Prone exercises as well. No ROM restrictions.  Quad sets, SLR, LAQ, SAQ, heel slides, stretching, prone flexion and extension ? ?Ankle theraband program, heel cord stretching, toe towel curls, etc ? ?No pillows under bend of knee when at rest, ok to place under heel to help work on extension. Can also use zero knee bone foam if available ? ?Hinged knee brace on at all times, unlocked.  Ok to work on [employer] without brace with therapist supervision.  Brace back on after ROM session if removed ?  ? ?- Pain management: ? Multimodal  ? ?- ABL anemia/Hemodynamics ? Modest drop  in h/h ? Pt asymptomatic  ? Think she was likely hemoconcentrated pre-op ?  ?- Medical issues  ? Vitamin d deficiency  ?  Supplement ? ?- DVT/PE prophylaxis: ? Dc home with xarelto 15 mg daily x 30 days ?- ID:  ? Periop abx ? ?- Metabolic Bone Disease: ? As above  ? ?- Activity: ? NWB L leg o/w as tolerated ? ?- FEN/GI prophylaxis/Foley/Lines: ? Reg diet ? Dc iv and ivf ? ?- Impediments to fracture healing: ? Vitamin d deficiency  ? ?- Dispo: ? Therapy evals ? DME ? Home this afternoon   ? Meds sent to [facility]  ? ? Plan of care discussed with RN who was present during eval  ? ? ? ? ?Mearl Latin, PA-C ?[phone] (C) ?03/27/2021, 9:40 AM ? ?[facility] ?[address] ?[place] [address] ?[phone] [place]) ?[phone] (F) ? ? ? ?After 5pm and on the weekends please log on to Amion, go to orthopaedics and the look under the [facility] Call for the provider(s) on call. You can also call our office at [phone] and then follow the prompts to be connected to the call team.  ? Patient ID: Savannah Hoffman, female   DOB: 1995/09/15, 26 y.o.   MRN: [id number] ? ?

## 2021-03-27 NOTE — Anesthesia Postprocedure Evaluation (Signed)
Anesthesia Post Note ? ?Patient: Savannah Hoffman ? ?Procedure(s) Performed: OPEN REDUCTION INTERNAL FIXATION (ORIF) TIBIAL PLATEAU (Left) ? ?  ? ?Patient location during evaluation: PACU ?Anesthesia Type: General ?Level of consciousness: awake and alert ?Pain management: pain level controlled ?Vital Signs Assessment: post-procedure vital signs reviewed and stable ?Respiratory status: spontaneous breathing, nonlabored ventilation, respiratory function stable and patient connected to nasal cannula oxygen ?Cardiovascular status: blood pressure returned to baseline and stable ?Postop Assessment: no apparent nausea or vomiting ?Anesthetic complications: no ? ? ?No notable events documented. ? ?Last Vitals:  ?Vitals:  ? 03/26/21 2152 03/27/21 0722  ?BP:  99/66  ?Pulse: (!) 104 86  ?Resp:  14  ?Temp: 37.1 ?C 36.8 ?C  ?SpO2: 98% 98%  ?  ?Last Pain:  ?Vitals:  ? 03/27/21 0837  ?TempSrc:   ?PainSc: 6   ? ? ?  ?  ?  ?  ?  ?  ? ?[name] ? ? ? ? ?

## 2021-03-27 NOTE — Plan of Care (Signed)

## 2021-03-27 NOTE — TOC Transition Note (Signed)
Transition of Care (TOC) - CM/SW Discharge Note ? ? ?Patient Details  ?Name: Savannah Hoffman ?MRN: [id number] ?Date of Birth: 1995-03-02 ? ?Transition of Care [facility]) CM/SW Contact:  ?Sharin Mons, RN ?Phone Number: ?03/27/2021, 10:50 AM ? ? ?Clinical Narrative:    ?Patient will DC to: home ?Anticipated DC date: 03/27/2021 ?Family notified: yes ?Transport by: car ? ?Pt with recent fall on 1/28 resulting in L tibial plateu fx,  s/p Open reduction internal fixation of left lateral tibial plateau. ? ?Per MD patient ready for DC today . RN, patient,  and patient's family notified of DC. Pt states will transition to parents home once d/c. States parents with assist with any needs.Pt declined home health servies per PT's recommendation:Home health PT. States has RW @ home. ? ?Pt without Rx med concern. ? ?Post hospital f/u noted on AVS. ? ?RNCM will sign off for now as intervention is no longer needed. Please consult [place] again if new needs arise.  ?Noemi Henes (Sister)       ?[phone]     ? ?Final next level of care: Home/Self Care ?Barriers to Discharge: No Barriers Identified ? ? ?Patient Goals and CMS Choice ?  ?  ?  ? ?Discharge Placement ?  ?           ?  ?  ?  ?  ? ?Discharge Plan and Services ?  ?  ?           ?  ?  ?  ?  ?  ?  ?  ?  ?  ?  ? ?Social Determinants of Health (SDOH) Interventions ?  ? ? ?Readmission Risk Interventions ?No flowsheet data found. ? ? ? ? ?

## 2021-03-27 NOTE — Evaluation (Signed)
Physical Therapy Evaluation ?Patient Details ?Name: Savannah Hoffman ?MRN: [id number] ?DOB: 08-15-1995 ?Today's Date: 03/27/2021 ? ?History of Present Illness ? Pt is a 26 y/o F s/p fall on 1/28 resulting in L tibial plateu fx. PMH included COVID and pre-diabetes. Pt will have unrestricted ROM but will be NWB on LLE for next 6 weeks with gradual weightbaering thereafter. ?  ?Clinical Impression ? Received pt supine in bed and agreeable to PT session. Pt very limited by pain during session and unable to tolerate LLE being in dependent position, crying and tearful throughout most of session. Educated pt on LLE NWB precautions and pt performed bed mobility with min A for LLE management. Pt transferred to/from bedside commode with RW and min A with good adherence to NWB precautions, and continent of bladder. Pt was unable to ambulate today due to pain but remains motivated to return home with her parents. Pt reports her sister has a RW that she can use at home. Acute PT to cont to follow.  ?   ? ?Recommendations for follow up therapy are one component of a multi-disciplinary discharge planning process, led by the attending physician.  Recommendations may be updated based on patient status, additional functional criteria and insurance authorization. ? ?Follow Up Recommendations Home health PT ? ?  ?Assistance Recommended at Discharge Intermittent Supervision/Assistance  ?Patient can return home with the following ? A little help with walking and/or transfers;A little help with bathing/dressing/bathroom;Assist for transportation ? ?  ?Equipment Recommendations Rolling walker (2 wheels)  ?Recommendations for Other Services ?    ?  ?Functional Status Assessment Patient has had a recent decline in their functional status and demonstrates the ability to make significant improvements in function in a reasonable and predictable amount of time.  ? ?  ?Precautions / Restrictions Precautions ?Precautions: Fall ?Restrictions ?Weight  Bearing Restrictions: Yes ?LLE Weight Bearing: Non weight bearing  ? ?  ? ?Mobility ? Bed Mobility ?Overal bed mobility: Needs Assistance ?Bed Mobility: Rolling, Supine to Sit, Sit to Supine ?Rolling: Supervision ?  ?Supine to sit: Min assist ?Sit to supine: Min assist ?  ?General bed mobility comments: pt required min A for LLE management - pt anticipating pain ?Patient Response: Anxious, Cooperative ? ?Transfers ?Overall transfer level: Needs assistance ?Equipment used: Rolling walker (2 wheels) ?Transfers: Sit to/from Stand, Bed to chair/wheelchair/BSC ?Sit to Stand: Min assist ?Stand pivot transfers: Min assist ?  ?  ?  ?  ?General transfer comment: pt required light min A for transfer, unable to lift RLE instead pivoting to get to/from commode - good adherance to LLE NWB precautions ?  ? ?Ambulation/Gait ?  ?  ?  ?  ?  ?  ?  ?  ? ?Stairs ?  ?  ?  ?  ?  ? ?Wheelchair Mobility ?  ? ?Modified Rankin (Stroke Patients Only) ?  ? ?  ? ?Balance Overall balance assessment: Needs assistance ?Sitting-balance support: Bilateral upper extremity supported, Feet supported ?Sitting balance-Leahy Scale: Fair ?  ?  ?Standing balance support: Bilateral upper extremity supported (RW) ?Standing balance-Leahy Scale: Poor ?Standing balance comment: pt required min guard for static/dynamic standing balance ?  ?  ?  ?  ?  ?  ?  ?  ?  ?  ?  ?   ? ? ? ?Pertinent Vitals/Pain Pain Assessment ?Pain Assessment: 0-10 ?Pain Score: 3  ?Pain Location: LLE ?Pain Descriptors / Indicators: Constant, Crying, Discomfort, Moaning, Operative site guarding ?Pain Intervention(s): Limited activity within patient's tolerance,  Monitored during session, Premedicated before session, Repositioned, Ice applied  ? ? ?Home Living Family/patient expects to be discharged to:: Private residence ?Living Arrangements: Parent ?Available Help at Discharge: Available PRN/intermittently ?Type of Home: House ?Home Access: Level entry ?  ?  ?  ?Home Layout: One level ?Home  Equipment: Conservation officer, nature (2 wheels) ?Additional Comments: pt reports her sister has RW  ?  ?Prior Function Prior Level of Function : Independent/Modified Independent ?  ?  ?  ?  ?  ?  ?  ?  ?  ? ? ?Hand Dominance  ? Dominant Hand: Right ? ?  ?Extremity/Trunk Assessment  ? Upper Extremity Assessment ?Upper Extremity Assessment: Overall WFL for tasks assessed ?  ? ?Lower Extremity Assessment ?Lower Extremity Assessment: Generalized weakness ?  ? ?Cervical / Trunk Assessment ?Cervical / Trunk Assessment: Normal  ?Communication  ? Communication: No difficulties  ?Cognition Arousal/Alertness: Awake/alert ?Behavior During Therapy: [facility] for tasks assessed/performed ?Overall Cognitive Status: Within Functional Limits for tasks assessed ?  ?  ?  ?  ?  ?  ?  ?  ?  ?  ?  ?  ?  ?  ?  ?  ?General Comments: very tearful and crying with mobility due to pain ?  ?  ? ?  ?General Comments General comments (skin integrity, edema, etc.): pt reported increased pain/unable to tolerate when LLE was in dependent positions ? ?  ?Exercises    ? ?Assessment/Plan  ?  ?PT Assessment Patient needs continued PT services  ?PT Problem List Decreased strength;Decreased range of motion;Decreased activity tolerance;Decreased balance;Pain ? ?   ?  ?PT Treatment Interventions DME instruction;Gait training;Stair training;Functional mobility training;Therapeutic activities;Therapeutic exercise;Balance training;Neuromuscular re-education;Patient/family education;Modalities   ? ?PT Goals (Current goals can be found in the Care Plan section)  ?Acute Rehab PT Goals ?Patient Stated Goal: to return home ?PT Goal Formulation: With patient ?Time For Goal Achievement: 04/03/21 ?Potential to Achieve Goals: Good ? ?  ?Frequency Min 5X/week ?  ? ? ?Co-evaluation   ?  ?  ?  ?  ? ? ?  ?AM-PAC PT "6 Clicks" Mobility  ?Outcome Measure Help needed turning from your back to your side while in a flat bed without using bedrails?: None ?Help needed moving from lying on your  back to sitting on the side of a flat bed without using bedrails?: A Little ?Help needed moving to and from a bed to a chair (including a wheelchair)?: A Little ?Help needed standing up from a chair using your arms (e.g., wheelchair or bedside chair)?: A Little ?Help needed to walk in hospital room?: A Little ?Help needed climbing 3-5 steps with a railing? : A Little ?6 Click Score: 19 ? ?  ?End of Session   ?Activity Tolerance: Patient limited by pain ?Patient left: in bed;with call bell/phone within reach ?Nurse Communication: Mobility status;Weight bearing status ?PT Visit Diagnosis: Other abnormalities of gait and mobility (R26.89);Muscle weakness (generalized) (M62.81);Pain ?Pain - Right/Left: Left ?Pain - part of body: Leg ?  ? ?Time: [phone] ?PT Time Calculation (min) (ACUTE ONLY): 24 min ? ? ?Charges:   PT Evaluation ?$PT Eval Low Complexity: 1 Low ?PT Treatments ?$Therapeutic Activity: 8-22 mins ?  ?   ? ? ?Becky Sax PT, DPT  ?Blenda Nicely ?03/27/2021, 9:51 AM ? ?

## 2021-03-27 NOTE — Discharge Instructions (Signed)
Orthopaedic Trauma Service Discharge Instructions   General Discharge Instructions  Orthopaedic Injuries:  Left tibial plateau fracture treated with open reduction internal fixation using plate and screws  WEIGHT BEARING STATUS: Nonweightbearing left leg using crutches or walker  RANGE OF MOTION/ACTIVITY: Unrestricted range of motion left knee.  Brace can be off for range of motion exercises but needs to be on when mobilizing  Bone health: Labs show vitamin D deficiency.  Please take vitamin D supplements that have been prescribed for you in addition to your vitamin C and zinc.  Would also recommend calcium supplementation 600 mg of elemental calcium twice a day  Review the following resource for additional information regarding bone health  [email or web address].cy  Wound Care: Daily wound care starting on 03/29/2021.  Please see instructions below.  Can leave wounds open to the air once they are dry and clean with soap and water only once there is no drainage Discharge Wound Care Instructions  Do NOT apply any ointments, solutions or lotions to pin sites or surgical wounds.  These prevent needed drainage and even though solutions like hydrogen peroxide kill bacteria, they also damage cells lining the pin sites that help fight infection.  Applying lotions or ointments can keep the wounds moist and can cause them to breakdown and open up as well. This can increase the risk for infection. When in doubt call the office.  Surgical incisions should be dressed daily.  If any drainage is noted, use one layer of adaptic or Mepitel, then gauze, Kerlix, and an ace wrap.  [email or web address] [email or web address]  [email or web address]  These dressing supplies should  be available at local medical supply stores ([facility], [facility], etc). They are not usually carried at places like [facility], [facility], [facility], etc  Once the incision is completely dry and without drainage, it may be left open to air out.  Showering may begin 36-48 hours later.  Cleaning gently with soap and water.   DVT/PE prophylaxis: Xarelto 15 mg daily x 30 days  Diet: as you were eating previously.  Can use over the counter stool softeners and bowel preparations, such as Miralax, to help with bowel movements.  Narcotics can be constipating.  Be sure to drink plenty of fluids  PAIN MEDICATION USE AND EXPECTATIONS  You have likely been given narcotic medications to help control your pain.  After a traumatic event that results in an fracture (broken bone) with or without surgery, it is ok to use narcotic pain medications to help control one's pain.  We understand that everyone responds to pain differently and each individual patient will be evaluated on a regular basis for the continued need for narcotic medications. Ideally, narcotic medication use should last no more than 6-8 weeks (coinciding with fracture healing).   As a patient it is your responsibility as well to monitor narcotic medication use and report the amount and frequency you use these medications when you come to your office visit.   We would also advise that if you are using narcotic medications, you should take a dose prior to therapy to maximize you participation.  IF YOU ARE ON NARCOTIC MEDICATIONS IT IS NOT PERMISSIBLE TO OPERATE A MOTOR VEHICLE (MOTORCYCLE/CAR/TRUCK/MOPED) OR HEAVY MACHINERY DO NOT MIX NARCOTICS WITH OTHER CNS (CENTRAL NERVOUS SYSTEM) DEPRESSANTS SUCH AS ALCOHOL   POST-OPERATIVE OPIOID TAPER INSTRUCTIONS: It is important to wean off of your opioid medication as soon as possible. If you do not need pain medication after your surgery it is ok to stop  day one. Opioids include: Codeine,  Hydrocodone(Norco, Vicodin), Oxycodone(Percocet, oxycontin) and hydromorphone amongst others.  Long term and even short term use of opiods can cause: Increased pain response Dependence Constipation Depression Respiratory depression And more.  Withdrawal symptoms can include Flu like symptoms Nausea, vomiting And more Techniques to manage these symptoms Hydrate well Eat regular healthy meals Stay active Use relaxation techniques(deep breathing, meditating, yoga) Do Not substitute Alcohol to help with tapering If you have been on opioids for less than two weeks and do not have pain than it is ok to stop all together.  Plan to wean off of opioids This plan should start within one week post op of your fracture surgery  Maintain the same interval or time between taking each dose and first decrease the dose.  Cut the total daily intake of opioids by one tablet each day Next start to increase the time between doses. The last dose that should be eliminated is the evening dose.    STOP SMOKING OR USING NICOTINE PRODUCTS!!!!  As discussed nicotine severely impairs your body's ability to heal surgical and traumatic wounds but also impairs bone healing.  Wounds and bone heal by forming microscopic blood vessels (angiogenesis) and nicotine is a vasoconstrictor (essentially, shrinks blood vessels).  Therefore, if vasoconstriction occurs to these microscopic blood vessels they essentially disappear and are unable to deliver necessary nutrients to the healing tissue.  This is one modifiable factor that you can do to dramatically increase your chances of healing your injury.    (This means no smoking, no nicotine gum, patches, etc)  DO NOT USE NONSTEROIDAL ANTI-INFLAMMATORY DRUGS (NSAID'S)  Using products such as Advil (ibuprofen), Aleve (naproxen), Motrin (ibuprofen) for additional pain control during fracture healing can delay and/or prevent the healing response.  If you would like to take over the  counter (OTC) medication, Tylenol (acetaminophen) is ok.  However, some narcotic medications that are given for pain control contain acetaminophen as well. Therefore, you should not exceed more than 4000 mg of tylenol in a day if you do not have liver disease.  Also note that there are may OTC medicines, such as cold medicines and allergy medicines that my contain tylenol as well.  If you have any questions about medications and/or interactions please ask your doctor/PA or your pharmacist.      ICE AND ELEVATE INJURED/OPERATIVE EXTREMITY  Using ice and elevating the injured extremity above your heart can help with swelling and pain control.  Icing in a pulsatile fashion, such as 20 minutes on and 20 minutes off, can be followed.    Do not place ice directly on skin. Make sure there is a barrier between to skin and the ice pack.    Using frozen items such as frozen peas works well as the conform nicely to the are that needs to be iced.  USE AN ACE WRAP OR TED HOSE FOR SWELLING CONTROL  In addition to icing and elevation, Ace wraps or TED hose are used to help limit and resolve swelling.  It is recommended to use Ace wraps or TED hose until you are informed to stop.    When using Ace Wraps start the wrapping distally (farthest away from the body) and wrap proximally (closer to the body)   Example: If you had surgery on your leg or thing and you do not have a splint on, start the ace wrap at the toes and work your way up to the thigh  If you had surgery on your upper extremity and do not have a splint on, start the ace wrap at your fingers and work your way up to the upper arm  IF YOU ARE IN A SPLINT OR CAST DO NOT REMOVE IT FOR ANY REASON   If your splint gets wet for any reason please contact the office immediately. You may shower in your splint or cast as long as you keep it dry.  This can be done by wrapping in a cast cover or garbage back (or similar)  Do Not stick any thing down your splint  or cast such as pencils, money, or hangers to try and scratch yourself with.  If you feel itchy take benadryl as prescribed on the bottle for itching  IF YOU ARE IN A CAM BOOT (BLACK BOOT)  You may remove boot periodically. Perform daily dressing changes as noted below.  Wash the liner of the boot regularly and wear a sock when wearing the boot. It is recommended that you sleep in the boot until told otherwise    Call office for the following: Temperature greater than 101F Persistent nausea and vomiting Severe uncontrolled pain Redness, tenderness, or signs of infection (pain, swelling, redness, odor or green/yellow discharge around the site) Difficulty breathing, headache or visual disturbances Hives Persistent dizziness or light-headedness Extreme fatigue Any other questions or concerns you may have after discharge  In an emergency, call 911 or go to an Emergency Department at a nearby hospital  HELPFUL INFORMATION  If you had a block, it will wear off between 8-24 hrs postop typically.  This is period when your pain may go from nearly zero to the pain you would have had postop without the block.  This is an abrupt transition but nothing dangerous is happening.  You may take an extra dose of narcotic when this happens.  You should wean off your narcotic medicines as soon as you are able.  Most patients will be off or using minimal narcotics before their first postop appointment.   We suggest you use the pain medication the first night prior to going to bed, in order to ease any pain when the anesthesia wears off. You should avoid taking pain medications on an empty stomach as it will make you nauseous.  Do not drink alcoholic beverages or take illicit drugs when taking pain medications.  In most states it is against the law to drive while you are in a splint or sling.  And certainly against the law to drive while taking narcotics.  You may return to work/school in the next couple of  days when you feel up to it.   Pain medication may make you constipated.  Below are a few solutions to try in this order: Decrease the amount of pain medication if you arent having pain. Drink lots of decaffeinated fluids. Drink prune juice and/or each dried prunes  If the first 3 dont work start with additional solutions Take Colace - an over-the-counter stool softener Take Senokot - an over-the-counter laxative Take Miralax - a stronger over-the-counter laxative     CALL THE OFFICE WITH ANY QUESTIONS OR CONCERNS: [phone]   VISIT OUR WEBSITE FOR ADDITIONAL INFORMATION: [email or web address]

## 2021-03-27 NOTE — Progress Notes (Signed)
Orthopedic Tech Progress Note ?Patient Details:  ?Savannah Hoffman ?28-Oct-1995 ?[id number] ? ?Patient ID: Savannah Hoffman, female   DOB: Sep 19, 1995, 26 y.o.   MRN: [id number] ?Bone foam will be delivered once they arrive. ? ?[place] ?03/27/2021, 9:56 AM ? ?

## 2021-03-27 NOTE — Plan of Care (Signed)

## 2021-03-27 NOTE — Discharge Summary (Cosign Needed)
Orthopaedic Trauma Service (OTS) Discharge Summary   Patient ID: Savannah Hoffman MRN: [id number] DOB/AGE: Feb 05, 1995 26 y.o.  Admit date: 03/26/2021 Discharge date: 03/27/2021  Admission Diagnoses: Closed left bicondylar tibial plateau fracture  Discharge Diagnoses:  Principal Problem:   Closed bicondylar fracture of left tibial plateau Active Problems:   Vitamin D deficiency   Past Medical History:  Diagnosis Date   COVID 12/2019   and also 2021 - no symptoms, 2022 was a mild case   Pre-diabetes    Vitamin D deficiency 03/27/2021     Procedures Performed: 03/26/2021-Dr. Carola Frost  1.  Open reduction internal fixation of left lateral tibial plateau. 2.  Anterior compartment fasciotomy. 3.  Application of stress under fluoroscopy.    Discharged Condition: good  Hospital Course:   Patient very pleasant 26 year old female who sustained a closed left bicondylar tibial plateau fracture approximate 2 weeks ago from a ground-level fall.  She was seen at an outside orthopedic office and referred to orthopedic trauma specialist for definitive management.  Initially based off of her plain films and CT scan we felt that trial of nonoperative management could be pursued however upon her second follow-up there was loss of alignment and height of her plateau and felt that given her age surgical intervention was warranted to maximize her long-term function.  Patient was in agreement with the plan.  Patient was taken to the OR on 03/26/2021 for procedure noted above.  Patient tolerated procedure well after surgery she was taken to the PACU for recovery from anesthesia and then transferred to the orthopedic floor for observation, pain control and therapies.  Pain was very well controlled on postop day 1 she mobilized very well with PT and OT and was deemed stable for discharge to home.  Pain is well controlled with oral pain medications.  She received appropriate perioperative antibiosis.  She  did have a fairly modest drop in her H&H from preop to postop day 1.  This was felt to be due to some hemoconcentration likely due to dehydration preoperatively.  She was completely asymptomatic prior to and after therapy and still deemed to be stable for discharge.  We did not encounter any significant blood loss during surgery.  Surgery was done without a tourniquet as well.  We did obtain metabolic bone labs on her which did show pretty significant vitamin D deficiency.  She was started on supplementation prior to discharge and this will be continued postoperatively.  She will be transition to Xarelto 15 mg daily for the next 30 days for DVT and PE prophylaxis.  Patient discharged in stable condition on 03/27/2021  Consults: None  Significant Diagnostic Studies: labs:    Latest Reference Range & Units 03/26/21 06:52 03/26/21 06:53 03/26/21 07:23 03/26/21 07:31 03/27/21 06:11  Sodium 135 - 145 mmol/L 138      Potassium 3.5 - 5.1 mmol/L 3.6      Chloride 98 - 111 mmol/L 107      CO2 22 - 32 mmol/L 23      Glucose 70 - 99 mg/dL 94      Mean Plasma Glucose mg/dL  201.00     BUN 6 - 20 mg/dL 8      Creatinine 7.12 - 1.00 mg/dL 1.97      Calcium 8.9 - 10.3 mg/dL 9.1      Anion gap 5 - 15  8      Alkaline Phosphatase 38 - 126 U/L 49      Albumin 3.5 -  5.0 g/dL 4.0      AST 15 - 41 U/L 40      ALT 0 - 44 U/L 28      Total Protein 6.5 - 8.1 g/dL 7.9      Total Bilirubin 0.3 - 1.2 mg/dL 0.5      GFR, Estimated >60 mL/min >60      Vitamin D, 25-Hydroxy 30 - 100 ng/mL 9.91 (L)      WBC 4.0 - 10.5 K/uL 7.6    12.2 (H)  RBC 3.87 - 5.11 MIL/uL 4.23    3.32 (L)  Hemoglobin 12.0 - 15.0 g/dL 81.1 (L)    8.8 (L)  HCT 36.0 - 46.0 % 34.9 (L)    27.8 (L)  MCV 80.0 - 100.0 fL 82.5    83.7  MCH 26.0 - 34.0 pg 27.2    26.5  MCHC 30.0 - 36.0 g/dL 91.4    78.2  RDW 95.6 - 15.5 % 13.8    13.5  Platelets 150 - 400 K/uL 295    255  nRBC 0.0 - 0.2 % 0.0    0.0  Neutrophils % 55      Lymphocytes % 35       Monocytes Relative % 8      Eosinophil % 1      Basophil % 1      Immature Granulocytes % 0      NEUT# 1.7 - 7.7 K/uL 4.1      Lymphocyte # 0.7 - 4.0 K/uL 2.7      Monocyte # 0.1 - 1.0 K/uL 0.6      Eosinophils Absolute 0.0 - 0.5 K/uL 0.1      Basophils Absolute 0.0 - 0.1 K/uL 0.0      Abs Immature Granulocytes 0.00 - 0.07 K/uL 0.02      Prothrombin Time 11.4 - 15.2 seconds 12.5      INR 0.8 - 1.2  0.9      Hemoglobin A1C 4.8 - 5.6 %  5.5     Preg Test, Ur NEGATIVE    NEGATIVE    URINALYSIS, ROUTINE W REFLEX MICROSCOPIC     Rpt !   Appearance CLEAR     CLOUDY !   Bilirubin Urine NEGATIVE     NEGATIVE   Color, Urine YELLOW     AMBER !   Glucose, UA NEGATIVE mg/dL    NEGATIVE   Hgb urine dipstick NEGATIVE     NEGATIVE   Ketones, ur NEGATIVE mg/dL    NEGATIVE   Leukocytes,Ua NEGATIVE     NEGATIVE   Nitrite NEGATIVE     NEGATIVE   pH 5.0 - 8.0     5.0   Protein NEGATIVE mg/dL    NEGATIVE   Specific Gravity, Urine 1.005 - 1.030     1.029   (L): Data is abnormally low (H): Data is abnormally high !: Data is abnormal Rpt: View report in Results Review for more information  Treatments: IV hydration, antibiotics: Ancef, analgesia: acetaminophen, Dilaudid, and oxy IR, anticoagulation: LMW heparin and Xarelto at dc, therapies: PT, OT, and RN, and surgery: as above  Discharge Exam:     Orthopaedic Trauma Service Progress Note   Patient ID: Savannah Hoffman MRN: [id number] DOB/AGE: 1995/09/01 26 y.o.   Subjective:   Doing very well Pain is controlled Wants to go home today Tolerating diet No other complaints or concerns   ROS As above Objective:    VITALS:  Vitals:    03/26/21 1436 03/26/21 1700 03/26/21 2152 03/27/21 0722  BP: 131/86 122/80   99/66  Pulse: 90 (!) 102 (!) 104 86  Resp: 16 16   14   Temp: 98.1 F (36.7 C) 99.2 F (37.3 C) 98.7 F (37.1 C) 98.3 F (36.8 C)  TempSrc: Oral Oral Oral Oral  SpO2: 97% 94% 98% 98%  Weight:          Height:               Estimated body mass index is 30.23 kg/m as calculated from the following:   Height as of this encounter: 5\' 1"  (1.549 m).   Weight as of this encounter: 72.6 kg.     Intake/Output      02/28 0701 03/01 0700 03/01 0701 03/02 0700   I.V. (mL/kg) 1005.7 (13.9)    IV Piggyback 50    Total Intake(mL/kg) 1055.7 (14.5)    Urine (mL/kg/hr) 500 (0.3)    Blood 100    Total Output 600    Net +455.7         Urine Occurrence 2 x       LABS   Lab Results Last 24 Hours       Results for orders placed or performed during the hospital encounter of 03/26/21 (from the past 24 hour(s))  CBC     Status: Abnormal    Collection Time: 03/27/21  6:11 AM  Result Value Ref Range    WBC 12.2 (H) 4.0 - 10.5 K/uL    RBC 3.32 (L) 3.87 - 5.11 MIL/uL    Hemoglobin 8.8 (L) 12.0 - 15.0 g/dL    HCT 16.1 (L) 09.6 - 46.0 %    MCV 83.7 80.0 - 100.0 fL    MCH 26.5 26.0 - 34.0 pg    MCHC 31.7 30.0 - 36.0 g/dL    RDW 04.5 40.9 - 81.1 %    Platelets 255 150 - 400 K/uL    nRBC 0.0 0.0 - 0.2 %          PHYSICAL EXAM:    Gen: resting comfortably in bed, NAD, appears well Lungs: unlabored  Cardiac: reg Abd: soft, NTND, + BS Ext:       Left Lower extremity              Dressing clean and dry              Knee resting in flexion              Brace is currently not on                          Brace was locked, I unlocked brace myself             Extremity is warm             Palpable DP pulse             Compartments are soft and nontender             No pain out of proportion with passive stretching of toes or ankle             DPN, SPN, TN sensory functions intact             EHL, FHL, lesser toe motor function intact             Ankle flexion, extension, inversion and eversion intact  Assessment/Plan: 1 Day Post-Op    Principal Problem:   Closed bicondylar fracture of left tibial plateau Active Problems:   Vitamin D deficiency     Anti-infectives (From admission, onward)         Start     Dose/Rate Route Frequency Ordered Stop    03/26/21 1600   ceFAZolin (ANCEF) IVPB 1 g/50 mL premix        1 g 100 mL/hr over 30 Minutes Intravenous Every 6 hours 03/26/21 1421 03/27/21 0600    03/26/21 0615   ceFAZolin (ANCEF) IVPB 2g/100 mL premix        2 g 200 mL/hr over 30 Minutes Intravenous On call to O.R. 03/26/21 4098 03/26/21 1191         .   POD/HD#: 25   26 year old female ground-level fall with left bicondylar tibial plateau fracture   -Left bicondylar tibial plateau fracture s/p ORIF             NWB Left leg x 6-8 weeks, crutches or walker to mobilize             Unrestricted ROM L knee                          Brace can be off for ROM exercises but needs to be on when mobilizing             Therapy evals             Dressing changes starting on 03/29/2021                         Reviewed with pt                Ice and elevate              Convert to TED hose once dressing changed                         RN to get hose for pt                PT- please teach HEP for R knee ROM- AROM, PROM. Prone exercises as well. No ROM restrictions.  Quad sets, SLR, LAQ, SAQ, heel slides, stretching, prone flexion and extension   Ankle theraband program, heel cord stretching, toe towel curls, etc   No pillows under bend of knee when at rest, ok to place under heel to help work on extension. Can also use zero knee bone foam if available   Hinged knee brace on at all times, unlocked.  Ok to work on [employer] without brace with therapist supervision.  Brace back on after ROM session if removed     - Pain management:             Multimodal    - ABL anemia/Hemodynamics             Modest drop in h/h             Pt asymptomatic              Think she was likely hemoconcentrated pre-op              - Medical issues              Vitamin d deficiency  Supplement   - DVT/PE prophylaxis:             Dc home with xarelto 15 mg daily x 30 days - ID:               Periop abx   - Metabolic Bone Disease:             As above    - Activity:             NWB L leg o/w as tolerated   - FEN/GI prophylaxis/Foley/Lines:             Reg diet             Dc iv and ivf   - Impediments to fracture healing:             Vitamin d deficiency    - Dispo:             Therapy evals             DME             Home this afternoon                Meds sent to [facility]                Plan of care discussed with RN who was present during eval       Disposition: Discharge disposition: 01-Home or Self Care       Discharge Instructions     Call MD / Call 911   Complete by: As directed    If you experience chest pain or shortness of breath, CALL 911 and be transported to the hospital emergency room.  If you develope a fever above 101 F, pus (white drainage) or increased drainage or redness at the wound, or calf pain, call your surgeon's office.   Constipation Prevention   Complete by: As directed    Drink plenty of fluids.  Prune juice may be helpful.  You may use a stool softener, such as Colace (over the counter) 100 mg twice a day.  Use MiraLax (over the counter) for constipation as needed.   Diet general   Complete by: As directed    Discharge instructions   Complete by: As directed    Orthopaedic Trauma Service Discharge Instructions   General Discharge Instructions  Orthopaedic Injuries:  Left tibial plateau fracture treated with open reduction internal fixation using plate and screws  WEIGHT BEARING STATUS: Nonweightbearing left leg using crutches or walker  RANGE OF MOTION/ACTIVITY: Unrestricted range of motion left knee.  Brace can be off for range of motion exercises but needs to be on when mobilizing  Bone health: Labs show vitamin D deficiency.  Please take vitamin D supplements that have been prescribed for you in addition to your vitamin C and zinc.  Would also recommend calcium supplementation 600 mg of elemental calcium twice a  day  Review the following resource for additional information regarding bone health  [email or web address][employer]cy  Wound Care: Daily wound care starting on 03/29/2021.  Please see instructions below.  Can leave wounds open to the air once they are dry and clean with soap and water only once there is no drainage Discharge Wound Care Instructions  Do NOT apply any ointments, solutions or lotions to pin sites or surgical wounds.  These prevent needed drainage and even though solutions like hydrogen peroxide kill bacteria, they also damage cells lining the pin sites that  help fight infection.  Applying lotions or ointments can keep the wounds moist and can cause them to breakdown and open up as well. This can increase the risk for infection. When in doubt call the office.  Surgical incisions should be dressed daily.  If any drainage is noted, use one layer of adaptic or Mepitel, then gauze, Kerlix, and an ace wrap.  [email or web address][employer]cz [email or web address]_[email or web address]  [email or web address]  These dressing supplies should be available at local medical supply stores ([employer], [facility] [employer], etc). They are not usually carried at places like [facility], [facility], [employer], etc  Once the incision is completely dry and without drainage, it may be left open to air out.  Showering may begin 36-48 hours later.  Cleaning gently with soap and water.   DVT/PE prophylaxis: Xarelto 15 mg daily x 30 days  Diet: as you were eating previously.  Can use over the counter stool softeners and bowel preparations, such as Miralax, to help with bowel movements.  Narcotics can be constipating.  Be sure to drink plenty of fluids  PAIN MEDICATION USE AND EXPECTATIONS  You have likely been given narcotic  medications to help control your pain.  After a traumatic event that results in an fracture (broken bone) with or without surgery, it is ok to use narcotic pain medications to help control one's pain.  We understand that everyone responds to pain differently and each individual patient will be evaluated on a regular basis for the continued need for narcotic medications. Ideally, narcotic medication use should last no more than 6-8 weeks (coinciding with fracture healing).   As a patient it is your responsibility as well to monitor narcotic medication use and report the amount and frequency you use these medications when you come to your office visit.   We would also advise that if you are using narcotic medications, you should take a dose prior to therapy to maximize you participation.  IF YOU ARE ON NARCOTIC MEDICATIONS IT IS NOT PERMISSIBLE TO OPERATE A MOTOR VEHICLE (MOTORCYCLE/CAR/TRUCK/MOPED) OR HEAVY MACHINERY DO NOT MIX NARCOTICS WITH OTHER CNS (CENTRAL NERVOUS SYSTEM) DEPRESSANTS SUCH AS ALCOHOL   POST-OPERATIVE OPIOID TAPER INSTRUCTIONS:  It is important to wean off of your opioid medication as soon as possible. If you do not need pain medication after your surgery it is ok to stop day one.  Opioids include:  o Codeine, Hydrocodone(Norco, Vicodin), Oxycodone(Percocet, oxycontin) and hydromorphone amongst others.   Long term and even short term use of opiods can cause:  o Increased pain response  o Dependence  o Constipation  o Depression  o Respiratory depression  o And more.   Withdrawal symptoms can include  o Flu like symptoms  o Nausea, vomiting  o And more  Techniques to manage these symptoms  o Hydrate well  o Eat regular healthy meals  o Stay active  o Use relaxation techniques(deep breathing, meditating, yoga)  Do Not substitute Alcohol to help with tapering  If you have been on opioids for less than two weeks and do not have pain than it is ok to stop all  together.   Plan to wean off of opioids  o This plan should start within one week post op of your fracture surgery   o Maintain the same interval or time between taking each dose and first decrease the dose.   o Cut the total daily intake of opioids by one tablet each day  o Next start to increase the time between doses.  o  The last dose that should be eliminated is the evening dose.    STOP SMOKING OR USING NICOTINE PRODUCTS!!!!  As discussed nicotine severely impairs your body's ability to heal surgical and traumatic wounds but also impairs bone healing.  Wounds and bone heal by forming microscopic blood vessels (angiogenesis) and nicotine is a vasoconstrictor (essentially, shrinks blood vessels).  Therefore, if vasoconstriction occurs to these microscopic blood vessels they essentially disappear and are unable to deliver necessary nutrients to the healing tissue.  This is one modifiable factor that you can do to dramatically increase your chances of healing your injury.    (This means no smoking, no nicotine gum, patches, etc)  DO NOT USE NONSTEROIDAL ANTI-INFLAMMATORY DRUGS (NSAID'S)  Using products such as Advil (ibuprofen), Aleve (naproxen), Motrin (ibuprofen) for additional pain control during fracture healing can delay and/or prevent the healing response.  If you would like to take over the counter (OTC) medication, Tylenol (acetaminophen) is ok.  However, some narcotic medications that are given for pain control contain acetaminophen as well. Therefore, you should not exceed more than 4000 mg of tylenol in a day if you do not have liver disease.  Also note that there are may OTC medicines, such as cold medicines and allergy medicines that my contain tylenol as well.  If you have any questions about medications and/or interactions please ask your doctor/PA or your pharmacist.      ICE AND ELEVATE INJURED/OPERATIVE EXTREMITY  Using ice and elevating the injured extremity above your heart  can help with swelling and pain control.  Icing in a pulsatile fashion, such as 20 minutes on and 20 minutes off, can be followed.    Do not place ice directly on skin. Make sure there is a barrier between to skin and the ice pack.    Using frozen items such as frozen peas works well as the conform nicely to the are that needs to be iced.  USE AN ACE WRAP OR TED HOSE FOR SWELLING CONTROL  In addition to icing and elevation, Ace wraps or TED hose are used to help limit and resolve swelling.  It is recommended to use Ace wraps or TED hose until you are informed to stop.    When using Ace Wraps start the wrapping distally (farthest away from the body) and wrap proximally (closer to the body)   Example: If you had surgery on your leg or thing and you do not have a splint on, start the ace wrap at the toes and work your way up to the thigh        If you had surgery on your upper extremity and do not have a splint on, start the ace wrap at your fingers and work your way up to the upper arm  IF YOU ARE IN A SPLINT OR CAST DO NOT REMOVE IT FOR ANY REASON   If your splint gets wet for any reason please contact the office immediately. You may shower in your splint or cast as long as you keep it dry.  This can be done by wrapping in a cast cover or garbage back (or similar)  Do Not stick any thing down your splint or cast such as pencils, money, or hangers to try and scratch yourself with.  If you feel itchy take benadryl as prescribed on the bottle for itching  IF YOU ARE IN A CAM BOOT (BLACK BOOT)  You may remove boot periodically. Perform daily dressing changes as noted below.  Wash the liner of the boot regularly and wear a sock when wearing the boot. It is recommended that you sleep in the boot until told otherwise    Call office for the following: ? Temperature greater than 101F ? Persistent nausea and vomiting ? Severe uncontrolled pain ? Redness, tenderness, or signs of infection (pain, swelling,  redness, odor or green/yellow discharge around the site) ? Difficulty breathing, headache or visual disturbances ? Hives ? Persistent dizziness or light-headedness ? Extreme fatigue ? Any other questions or concerns you may have after discharge  In an emergency, call 911 or go to an Emergency Department at a nearby hospital  HELPFUL INFORMATION  ? If you had a block, it will wear off between 8-24 hrs postop typically.  This is period when your pain may go from nearly zero to the pain you would have had postop without the block.  This is an abrupt transition but nothing dangerous is happening.  You may take an extra dose of narcotic when this happens.  ? You should wean off your narcotic medicines as soon as you are able.  Most patients will be off or using minimal narcotics before their first postop appointment.   ? We suggest you use the pain medication the first night prior to going to bed, in order to ease any pain when the anesthesia wears off. You should avoid taking pain medications on an empty stomach as it will make you nauseous.  ? Do not drink alcoholic beverages or take illicit drugs when taking pain medications.  ? In most states it is against the law to drive while you are in a splint or sling.  And certainly against the law to drive while taking narcotics.  ? You may return to work/school in the next couple of days when you feel up to it.   ? Pain medication may make you constipated.  Below are a few solutions to try in this order:   ? Decrease the amount of pain medication if you aren't having pain.   ? Drink lots of decaffeinated fluids.   ? Drink prune juice and/or each dried prunes   o If the first 3 don't work start with additional solutions   ? Take Colace - an over-the-counter stool softener   ? Take Senokot - an over-the-counter laxative   ? Take Miralax - a stronger over-the-counter laxative     CALL THE OFFICE WITH ANY QUESTIONS OR CONCERNS: [phone]    VISIT OUR WEBSITE FOR ADDITIONAL INFORMATION: [email or web address]   Do not put a pillow under the knee. Place it under the heel.   Complete by: As directed    Driving restrictions   Complete by: As directed    No driving until you are off all narcotic medications   Increase activity slowly as tolerated   Complete by: As directed    Non weight bearing   Complete by: As directed    Laterality: left   Extremity: Lower   Post-operative opioid taper instructions:   Complete by: As directed    POST-OPERATIVE OPIOID TAPER INSTRUCTIONS: It is important to wean off of your opioid medication as soon as possible. If you do not need pain medication after your surgery it is ok to stop day one. Opioids include: Codeine, Hydrocodone(Norco, Vicodin), Oxycodone(Percocet, oxycontin) and hydromorphone amongst others.  Long term and even short term use of opiods can cause: Increased pain response Dependence Constipation Depression Respiratory depression And more.  Withdrawal symptoms can include Flu  like symptoms Nausea, vomiting And more Techniques to manage these symptoms Hydrate well Eat regular healthy meals Stay active Use relaxation techniques(deep breathing, meditating, yoga) Do Not substitute Alcohol to help with tapering If you have been on opioids for less than two weeks and do not have pain than it is ok to stop all together.  Plan to wean off of opioids This plan should start within one week post op of your joint replacement. Maintain the same interval or time between taking each dose and first decrease the dose.  Cut the total daily intake of opioids by one tablet each day Next start to increase the time between doses. The last dose that should be eliminated is the evening dose.         Allergies as of 03/27/2021   No Known Allergies      Medication List     TAKE these medications    acetaminophen 500 MG tablet Commonly known as: TYLENOL Take 1 tablet (500 mg  total) by mouth every 12 (twelve) hours.   ascorbic acid 1000 MG tablet Commonly known as: VITAMIN C Take 1 tablet (1,000 mg total) by mouth daily.   docusate sodium 100 MG capsule Commonly known as: COLACE Take 1 capsule (100 mg total) by mouth 2 (two) times daily.   methocarbamol 500 MG tablet Commonly known as: ROBAXIN Take 1-2 tablets (500-1,000 mg total) by mouth every 6 (six) hours as needed for muscle spasms.   oxyCODONE-acetaminophen 5-325 MG tablet Commonly known as: PERCOCET/ROXICET Take 1-2 tablets by mouth every 6 (six) hours as needed for severe pain or moderate pain.   Rivaroxaban 15 MG Tabs tablet Commonly known as: XARELTO Take 1 tablet (15 mg total) by mouth daily with supper.   Vitamin D (Ergocalciferol) 1.25 MG (50000 UNIT) Caps capsule Commonly known as: DRISDOL Take 1 capsule (50,000 Units total) by mouth every 7 (seven) days.   Vitamin D 125 MCG (5000 UT) Caps Take 1 capsule by mouth daily. What changed:  medication strength when to take this   zinc sulfate 220 (50 Zn) MG capsule Take 1 capsule (220 mg total) by mouth daily.               Discharge Care Instructions  (From admission, onward)           Start     Ordered   03/27/21 0000  Non weight bearing       Question Answer Comment  Laterality left   Extremity Lower      03/27/21 1016            Follow-up Information     Myrene GalasHandy, Michael, MD. Schedule an appointment as soon as possible for a visit in 2 week(s).   Specialty: Orthopedic Surgery Contact information: [address] [place] [address] [phone]                 Discharge Instructions and Plan:  26 year old female ground-level fall with left bicondylar tibial plateau fracture status post ORIF left tibial plateau   Weightbearing: NWB LLE Insicional and dressing care: Daily dressing changes with adaptic, 4x4s and TED hose starting on 03/29/2021 Orthopedic device(s):  Hinged brace unlocked,  walker or crutches Showering: Okay to shower once wounds are dry.  Clean with soap and water only VTE prophylaxis: Xarelto 15mg   daily x 30 days Pain control: Multimodal including Tylenol, Percocet, Robaxin Bone Health/Optimization: Labs show vitamin D deficiency.  Supplement with vitamin D3, vitamin D 2, vitamin C,  zinc, calcium citrate Follow - up plan: 2 weeks Contact information: Myrene Galas MD, Montez Morita PA-C   Signed:  Mearl Latin, PA-C [phone] (C) 03/27/2021, 10:16 AM  [facility] [address] [place] [address] [phone] Savannah Hoffman (F)

## 2021-03-29 ENCOUNTER — Encounter (HOSPITAL_COMMUNITY): Payer: Self-pay | Admitting: Orthopedic Surgery

## 2021-04-01 ENCOUNTER — Other Ambulatory Visit (HOSPITAL_COMMUNITY): Payer: Self-pay

## 2021-04-02 ENCOUNTER — Telehealth (HOSPITAL_COMMUNITY): Payer: Self-pay | Admitting: Pharmacist

## 2021-04-02 ENCOUNTER — Other Ambulatory Visit (HOSPITAL_COMMUNITY): Payer: Self-pay

## 2021-04-02 NOTE — Telephone Encounter (Signed)
Pharmacy Transitions of Care Follow-up Telephone Call ? ?Date of discharge: 03/27/21  ?Discharge Diagnosis: s/p ORIF on Xarelto for DVT/PE prophylaxis ? ?How have you been since you were released from the hospital? Doing good ? ?Medication changes made at discharge: ?START taking: ?Acetaminophen Extra Strength (acetaminophen)  ?docusate sodium (COLACE)  ?methocarbamol (ROBAXIN)  ?oxyCODONE-acetaminophen (PERCOCET/ROXICET)  ?vitamin C  ?Vitamin D (Ergocalciferol) (DRISDOL)  ?Xarelto (Rivaroxaban)  ?Zinc Sulfate  ?CHANGE how you take: ?Vitamin D  ?Medication changes verified by the patient?  Yes ?  ? ?Medication Accessibility: ? ?Home Pharmacy: [facility] in [place], [place]  ? ?Was the patient provided with refills on discharged medications? Y  ? ?Have all prescriptions been transferred from [facility] to home pharmacy?  Y ? ?Is the patient able to afford medications? N/A (pt to be on Xarelto for 30 days only and she was discharged with 30 tabs) ?  ? ?Medication Review: ?RIVAROXABAN (XARELTO)  ?Rivaroxaban 15 mg QD with supper on 03/27/21 ?- Discussed importance of taking medication with food and around the same time everyday  ?- Reviewed potential DDIs with patient  ?- Advised patient of medications to avoid (NSAIDs, ASA)  ?- Educated that Tylenol (acetaminophen) will be the preferred analgesic to prevent risk of bleeding  ?- Emphasized importance of monitoring for signs and symptoms of bleeding (abnormal bruising, prolonged bleeding, nose bleeds, bleeding from gums, discolored urine, black tarry stools)  ?- Advised patient to alert all providers of anticoagulation therapy prior to starting a new medication or having a procedure  ? ?Follow-up Appointments: ? ? ?[facility] f/u appt confirmed?  Scheduled to see Dr. Marcelino Scot on 04/10/21 @ 3:45 pm per pt.  ? ?If their condition worsens, is the pt aware to call PCP or go to the Emergency Dept.? Y ? ?Final Patient Assessment: ? -Pt is doing good  ?-Pt verbalized understanding of  Xarelto  ?-Pt has post discharge appointment and refill sent to [facility]  ? ?Garnet Sierras, PharmD  ?

## 2021-04-03 ENCOUNTER — Other Ambulatory Visit (HOSPITAL_COMMUNITY): Payer: Self-pay

## 2021-04-04 ENCOUNTER — Other Ambulatory Visit (HOSPITAL_COMMUNITY): Payer: Self-pay

## 2021-04-13 ENCOUNTER — Other Ambulatory Visit (HOSPITAL_COMMUNITY): Payer: Self-pay

## 2022-03-16 DIAGNOSIS — O99210 Obesity complicating pregnancy, unspecified trimester: Secondary | ICD-10-CM | POA: Insufficient documentation

## 2022-03-16 DIAGNOSIS — Z2839 Other underimmunization status: Secondary | ICD-10-CM | POA: Insufficient documentation

## 2022-03-16 DIAGNOSIS — O0993 Supervision of high risk pregnancy, unspecified, third trimester: Secondary | ICD-10-CM | POA: Insufficient documentation

## 2022-03-16 DIAGNOSIS — Z3403 Encounter for supervision of normal first pregnancy, third trimester: Secondary | ICD-10-CM | POA: Insufficient documentation

## 2022-03-16 DIAGNOSIS — O09899 Supervision of other high risk pregnancies, unspecified trimester: Secondary | ICD-10-CM | POA: Insufficient documentation

## 2022-03-26 ENCOUNTER — Other Ambulatory Visit: Payer: Self-pay

## 2022-03-26 MED ORDER — PRENATAL 27-1 MG PO TABS
1.0000 | ORAL_TABLET | Freq: Every day | ORAL | 6 refills | Status: DC
  Filled 2022-03-26: qty 30, 30d supply, fill #0

## 2022-04-18 ENCOUNTER — Other Ambulatory Visit: Payer: Self-pay

## 2022-04-18 MED ORDER — ONDANSETRON HCL 4 MG PO TABS
4.0000 mg | ORAL_TABLET | Freq: Three times a day (TID) | ORAL | 0 refills | Status: DC | PRN
  Filled 2022-04-18: qty 30, 10d supply, fill #0

## 2022-04-25 ENCOUNTER — Other Ambulatory Visit: Payer: Self-pay

## 2022-05-16 ENCOUNTER — Other Ambulatory Visit: Payer: Self-pay

## 2022-05-16 MED ORDER — PRENATAL 27-1 MG PO TABS
1.0000 | ORAL_TABLET | Freq: Every day | ORAL | 6 refills | Status: DC
  Filled 2022-05-16 (×2): qty 30, 30d supply, fill #0
  Filled 2022-07-01: qty 30, 30d supply, fill #1

## 2022-05-28 ENCOUNTER — Other Ambulatory Visit: Payer: Self-pay

## 2022-07-01 ENCOUNTER — Other Ambulatory Visit: Payer: Self-pay

## 2022-07-03 ENCOUNTER — Other Ambulatory Visit: Payer: Self-pay

## 2022-07-03 MED ORDER — PRENATAL 28-0.8 MG PO TABS
1.0000 | ORAL_TABLET | Freq: Every day | ORAL | 6 refills | Status: DC
  Filled 2022-07-03: qty 30, 30d supply, fill #0

## 2022-07-11 ENCOUNTER — Other Ambulatory Visit: Payer: Self-pay

## 2022-07-11 MED ORDER — ESCITALOPRAM OXALATE 10 MG PO TABS
10.0000 mg | ORAL_TABLET | Freq: Every day | ORAL | 1 refills | Status: DC
  Filled 2022-07-11 (×2): qty 90, 90d supply, fill #0

## 2022-07-11 MED ORDER — METRONIDAZOLE 500 MG PO TABS
500.0000 mg | ORAL_TABLET | Freq: Two times a day (BID) | ORAL | 0 refills | Status: DC
  Filled 2022-07-11: qty 14, 7d supply, fill #0

## 2022-08-22 ENCOUNTER — Other Ambulatory Visit: Payer: Self-pay

## 2022-08-22 MED ORDER — METRONIDAZOLE 500 MG PO TABS
500.0000 mg | ORAL_TABLET | Freq: Two times a day (BID) | ORAL | 0 refills | Status: DC
  Filled 2022-08-22: qty 14, 7d supply, fill #0

## 2022-09-03 ENCOUNTER — Inpatient Hospital Stay: Payer: Managed Care, Other (non HMO) | Admitting: Anesthesiology

## 2022-09-03 ENCOUNTER — Encounter
Admission: EM | Disposition: A | Discharge status: Home / Self Care | Payer: Self-pay | Source: Home / Self Care | Attending: Obstetrics

## 2022-09-03 ENCOUNTER — Encounter: Payer: Self-pay | Admitting: Obstetrics and Gynecology

## 2022-09-03 ENCOUNTER — Inpatient Hospital Stay: Payer: Managed Care, Other (non HMO)

## 2022-09-03 ENCOUNTER — Observation Stay: Payer: Managed Care, Other (non HMO)

## 2022-09-03 ENCOUNTER — Inpatient Hospital Stay
Admission: EM | Admit: 2022-09-03 | Discharge: 2022-09-06 | DRG: 786 | Disposition: A | Discharge status: Home / Self Care | Payer: Managed Care, Other (non HMO)

## 2022-09-03 DIAGNOSIS — R000 Tachycardia, unspecified: Secondary | ICD-10-CM | POA: Diagnosis present

## 2022-09-03 DIAGNOSIS — O1423 HELLP syndrome (HELLP), third trimester: Secondary | ICD-10-CM | POA: Diagnosis present

## 2022-09-03 DIAGNOSIS — O85 Puerperal sepsis: Secondary | ICD-10-CM | POA: Diagnosis not present

## 2022-09-03 DIAGNOSIS — D65 Disseminated intravascular coagulation [defibrination syndrome]: Secondary | ICD-10-CM | POA: Diagnosis present

## 2022-09-03 DIAGNOSIS — O9912 Other diseases of the blood and blood-forming organs and certain disorders involving the immune mechanism complicating childbirth: Secondary | ICD-10-CM | POA: Diagnosis present

## 2022-09-03 DIAGNOSIS — Z3A35 35 weeks gestation of pregnancy: Secondary | ICD-10-CM | POA: Diagnosis not present

## 2022-09-03 DIAGNOSIS — O99892 Other specified diseases and conditions complicating childbirth: Secondary | ICD-10-CM | POA: Diagnosis present

## 2022-09-03 DIAGNOSIS — Z8616 Personal history of COVID-19: Secondary | ICD-10-CM

## 2022-09-03 DIAGNOSIS — O1425 HELLP syndrome, complicating the puerperium: Secondary | ICD-10-CM | POA: Diagnosis present

## 2022-09-03 DIAGNOSIS — D62 Acute posthemorrhagic anemia: Secondary | ICD-10-CM | POA: Diagnosis present

## 2022-09-03 DIAGNOSIS — O364XX0 Maternal care for intrauterine death, not applicable or unspecified: Secondary | ICD-10-CM | POA: Diagnosis present

## 2022-09-03 DIAGNOSIS — O9081 Anemia of the puerperium: Secondary | ICD-10-CM | POA: Diagnosis not present

## 2022-09-03 DIAGNOSIS — O26899 Other specified pregnancy related conditions, unspecified trimester: Principal | ICD-10-CM | POA: Diagnosis present

## 2022-09-03 DIAGNOSIS — Z7901 Long term (current) use of anticoagulants: Secondary | ICD-10-CM

## 2022-09-03 DIAGNOSIS — O45029 Premature separation of placenta with disseminated intravascular coagulation, unspecified trimester: Secondary | ICD-10-CM | POA: Diagnosis present

## 2022-09-03 DIAGNOSIS — O458X3 Other premature separation of placenta, third trimester: Secondary | ICD-10-CM | POA: Diagnosis present

## 2022-09-03 DIAGNOSIS — Z79899 Other long term (current) drug therapy: Secondary | ICD-10-CM

## 2022-09-03 DIAGNOSIS — O1413 Severe pre-eclampsia, third trimester: Secondary | ICD-10-CM | POA: Diagnosis present

## 2022-09-03 HISTORY — DX: Maternal care for intrauterine death, not applicable or unspecified: O36.4XX0

## 2022-09-03 LAB — TYPE AND SCREEN
ABO/RH(D): O POS
Antibody Screen: NEGATIVE
Unit division: 0
Unit division: 0
Unit division: 0
Unit division: 0
Unit division: 0
Unit division: 0
Unit division: 0
Unit division: 0
Unit division: 0

## 2022-09-03 LAB — HEMOGLOBIN A1C
Hgb A1c MFr Bld: 5.5 % (ref 4.8–5.6)
Mean Plasma Glucose: 111.15 mg/dL

## 2022-09-03 LAB — BPAM CRYOPRECIPITATE
Blood Product Expiration Date: 202408071932
Blood Product Expiration Date: 202408071934
Blood Product Expiration Date: 202408080436
Blood Product Expiration Date: 202408080449
ISSUE DATE / TIME: 202408071614
ISSUE DATE / TIME: 202408071614
ISSUE DATE / TIME: 202408072306
ISSUE DATE / TIME: 202408072306
Unit Type and Rh: 5100
Unit Type and Rh: 5100
Unit Type and Rh: 5100
Unit Type and Rh: 6200

## 2022-09-03 LAB — BPAM FFP
Blood Product Expiration Date: 202408122359
Blood Product Expiration Date: 202408122359
Blood Product Expiration Date: 202408122359
Blood Product Expiration Date: 202408122359
Blood Product Expiration Date: 202408122359
Blood Product Expiration Date: 202408122359
Blood Product Expiration Date: 202408122359
Blood Product Expiration Date: 202408122359
ISSUE DATE / TIME: 202408072033
ISSUE DATE / TIME: 202408072033
ISSUE DATE / TIME: 202408072033
ISSUE DATE / TIME: 202408072033
Unit Type and Rh: 5100
Unit Type and Rh: 6200
Unit Type and Rh: 6200
Unit Type and Rh: 6200
Unit Type and Rh: 6200
Unit Type and Rh: 7300
Unit Type and Rh: 7300
Unit Type and Rh: 9500

## 2022-09-03 LAB — BASIC METABOLIC PANEL
Anion gap: 11 (ref 5–15)
Anion gap: 4 — ABNORMAL LOW (ref 5–15)
BUN: 19 mg/dL (ref 6–20)
BUN: 19 mg/dL (ref 6–20)
CO2: 15 mmol/L — ABNORMAL LOW (ref 22–32)
CO2: 17 mmol/L — ABNORMAL LOW (ref 22–32)
Calcium: 7.6 mg/dL — ABNORMAL LOW (ref 8.9–10.3)
Calcium: 7.1 mg/dL — ABNORMAL LOW (ref 8.9–10.3)
Chloride: 109 mmol/L (ref 98–111)
Chloride: 111 mmol/L (ref 98–111)
Creatinine, Ser: 1.34 mg/dL — ABNORMAL HIGH (ref 0.44–1.00)
Creatinine, Ser: 1.18 mg/dL — ABNORMAL HIGH (ref 0.44–1.00)
GFR, Estimated: 56 mL/min — ABNORMAL LOW (ref >60)
GFR, Estimated: >60 mL/min (ref >60)
Glucose, Bld: 116 mg/dL — ABNORMAL HIGH (ref 70–99)
Glucose, Bld: 118 mg/dL — ABNORMAL HIGH (ref 70–99)
Potassium: 5.5 mmol/L — ABNORMAL HIGH (ref 3.5–5.1)
Potassium: 4.8 mmol/L (ref 3.5–5.1)
Sodium: 135 mmol/L (ref 135–145)
Sodium: 132 mmol/L — ABNORMAL LOW (ref 135–145)

## 2022-09-03 LAB — BPAM RBC
Blood Product Expiration Date: 202408142359
Blood Product Expiration Date: 202409022359
Blood Product Expiration Date: 202409052359
Blood Product Expiration Date: 202409062359
Blood Product Expiration Date: 202409062359
Blood Product Expiration Date: 202409062359
Blood Product Expiration Date: 202409062359
Blood Product Expiration Date: 202409062359
Blood Product Expiration Date: 202409062359
ISSUE DATE / TIME: 202408071302
ISSUE DATE / TIME: 202408072032
ISSUE DATE / TIME: 202408072032
ISSUE DATE / TIME: 202408072032
ISSUE DATE / TIME: 202408072032
Unit Type and Rh: 5100
Unit Type and Rh: 5100
Unit Type and Rh: 5100
Unit Type and Rh: 5100
Unit Type and Rh: 5100
Unit Type and Rh: 5100
Unit Type and Rh: 5100
Unit Type and Rh: 5100
Unit Type and Rh: 5100

## 2022-09-03 LAB — HEMOGLOBIN AND HEMATOCRIT, BLOOD
HCT: 18.4 % — ABNORMAL LOW (ref 36.0–46.0)
Hemoglobin: 6.3 g/dL — ABNORMAL LOW (ref 12.0–15.0)

## 2022-09-03 LAB — PREPARE FRESH FROZEN PLASMA
Unit division: 0
Unit division: 0
Unit division: 0
Unit division: 0
Unit division: 0

## 2022-09-03 LAB — COMPREHENSIVE METABOLIC PANEL
ALT: 16 U/L (ref 0–44)
AST: 39 U/L (ref 15–41)
Albumin: 2.8 g/dL — ABNORMAL LOW (ref 3.5–5.0)
Alkaline Phosphatase: 147 U/L — ABNORMAL HIGH (ref 38–126)
Anion gap: 5 (ref 5–15)
BUN: 12 mg/dL (ref 6–20)
CO2: 18 mmol/L — ABNORMAL LOW (ref 22–32)
Calcium: 7.9 mg/dL — ABNORMAL LOW (ref 8.9–10.3)
Chloride: 109 mmol/L (ref 98–111)
Creatinine, Ser: 0.54 mg/dL (ref 0.44–1.00)
GFR, Estimated: >60 mL/min (ref >60)
Glucose, Bld: 84 mg/dL (ref 70–99)
Potassium: 3.6 mmol/L (ref 3.5–5.1)
Sodium: 132 mmol/L — ABNORMAL LOW (ref 135–145)
Total Bilirubin: 0.3 mg/dL (ref 0.3–1.2)
Total Protein: 6 g/dL — ABNORMAL LOW (ref 6.5–8.1)

## 2022-09-03 LAB — PREPARE CRYOPRECIPITATE
Unit division: 0
Unit division: 0
Unit division: 0
Unit division: 0

## 2022-09-03 LAB — PREPARE PLATELET PHERESIS
Unit division: 0
Unit division: 0

## 2022-09-03 LAB — CBC
HCT: 23.2 % — ABNORMAL LOW (ref 36.0–46.0)
HCT: 22.4 % — ABNORMAL LOW (ref 36.0–46.0)
HCT: 20.4 % — ABNORMAL LOW (ref 36.0–46.0)
HCT: 20.4 % — ABNORMAL LOW (ref 36.0–46.0)
Hemoglobin: 7.5 g/dL — ABNORMAL LOW (ref 12.0–15.0)
Hemoglobin: 7.5 g/dL — ABNORMAL LOW (ref 12.0–15.0)
Hemoglobin: 6.9 g/dL — ABNORMAL LOW (ref 12.0–15.0)
Hemoglobin: 6.7 g/dL — ABNORMAL LOW (ref 12.0–15.0)
MCH: 27.1 pg (ref 26.0–34.0)
MCH: 27.5 pg (ref 26.0–34.0)
MCH: 27.4 pg (ref 26.0–34.0)
MCH: 27.6 pg (ref 26.0–34.0)
MCHC: 32.3 g/dL (ref 30.0–36.0)
MCHC: 33.5 g/dL (ref 30.0–36.0)
MCHC: 33.8 g/dL (ref 30.0–36.0)
MCHC: 32.8 g/dL (ref 30.0–36.0)
MCV: 83.8 fL (ref 80.0–100.0)
MCV: 82.1 fL (ref 80.0–100.0)
MCV: 81 fL (ref 80.0–100.0)
MCV: 84 fL (ref 80.0–100.0)
Platelets: 150 10*3/uL (ref 150–400)
Platelets: 141 10*3/uL — ABNORMAL LOW (ref 150–400)
Platelets: 104 10*3/uL — ABNORMAL LOW (ref 150–400)
Platelets: 90 10*3/uL — ABNORMAL LOW (ref 150–400)
RBC: 2.77 MIL/uL — ABNORMAL LOW (ref 3.87–5.11)
RBC: 2.73 MIL/uL — ABNORMAL LOW (ref 3.87–5.11)
RBC: 2.52 MIL/uL — ABNORMAL LOW (ref 3.87–5.11)
RBC: 2.43 MIL/uL — ABNORMAL LOW (ref 3.87–5.11)
RDW: 14.3 % (ref 11.5–15.5)
RDW: 14.5 % (ref 11.5–15.5)
RDW: 14.7 % (ref 11.5–15.5)
RDW: 15.9 % — ABNORMAL HIGH (ref 11.5–15.5)
WBC: 20.7 10*3/uL — ABNORMAL HIGH (ref 4.0–10.5)
WBC: 22.6 10*3/uL — ABNORMAL HIGH (ref 4.0–10.5)
WBC: 26.8 10*3/uL — ABNORMAL HIGH (ref 4.0–10.5)
WBC: 23.1 10*3/uL — ABNORMAL HIGH (ref 4.0–10.5)
nRBC: 0 % (ref 0.0–0.2)
nRBC: 0 % (ref 0.0–0.2)
nRBC: 0 % (ref 0.0–0.2)
nRBC: 0.1 % (ref 0.0–0.2)

## 2022-09-03 LAB — MASSIVE TRANSFUSION PROTOCOL ORDER (BLOOD BANK NOTIFICATION)

## 2022-09-03 LAB — CBC WITH DIFFERENTIAL/PLATELET
Abs Immature Granulocytes: 0.18 10*3/uL — ABNORMAL HIGH (ref 0.00–0.07)
Basophils Absolute: 0 10*3/uL (ref 0.0–0.1)
Basophils Relative: 0 %
Eosinophils Absolute: 0.1 10*3/uL (ref 0.0–0.5)
Eosinophils Relative: 1 %
HCT: 30.1 % — ABNORMAL LOW (ref 36.0–46.0)
Hemoglobin: 9.8 g/dL — ABNORMAL LOW (ref 12.0–15.0)
Immature Granulocytes: 1 %
Lymphocytes Relative: 16 %
Lymphs Abs: 2.8 10*3/uL (ref 0.7–4.0)
MCH: 26.9 pg (ref 26.0–34.0)
MCHC: 32.6 g/dL (ref 30.0–36.0)
MCV: 82.7 fL (ref 80.0–100.0)
Monocytes Absolute: 1 10*3/uL (ref 0.1–1.0)
Monocytes Relative: 6 %
Neutro Abs: 13.3 10*3/uL — ABNORMAL HIGH (ref 1.7–7.7)
Neutrophils Relative %: 76 %
Platelets: 181 10*3/uL (ref 150–400)
RBC: 3.64 MIL/uL — ABNORMAL LOW (ref 3.87–5.11)
RDW: 14.4 % (ref 11.5–15.5)
WBC: 17.5 10*3/uL — ABNORMAL HIGH (ref 4.0–10.5)
nRBC: 0 % (ref 0.0–0.2)

## 2022-09-03 LAB — PROTEIN / CREATININE RATIO, URINE
Creatinine, Urine: 445 mg/dL
Protein Creatinine Ratio: 1.31 mg/mg{Cre} — ABNORMAL HIGH (ref 0.00–0.15)
Total Protein, Urine: 584 mg/dL

## 2022-09-03 LAB — URINALYSIS, ROUTINE W REFLEX MICROSCOPIC
Bilirubin Urine: NEGATIVE
Glucose, UA: 50 mg/dL — AB
Ketones, ur: NEGATIVE mg/dL
Leukocytes,Ua: NEGATIVE
Nitrite: NEGATIVE
Protein, ur: >=300 mg/dL — AB
Specific Gravity, Urine: 1.027 (ref 1.005–1.030)
pH: 5 (ref 5.0–8.0)

## 2022-09-03 LAB — BPAM PLATELET PHERESIS
Blood Product Expiration Date: 202408072359
Blood Product Expiration Date: 202408102359
ISSUE DATE / TIME: 202408071902
Unit Type and Rh: 6200
Unit Type and Rh: 7300

## 2022-09-03 LAB — HIV ANTIBODY (ROUTINE TESTING W REFLEX): HIV Screen 4th Generation wRfx: NONREACTIVE

## 2022-09-03 LAB — KLEIHAUER-BETKE STAIN
Fetal Cells %: 0 %
Quantitation Fetal Hemoglobin: 0 mL

## 2022-09-03 LAB — HEPATIC FUNCTION PANEL
ALT: 16 U/L (ref 0–44)
AST: 55 U/L — ABNORMAL HIGH (ref 15–41)
Albumin: 2.9 g/dL — ABNORMAL LOW (ref 3.5–5.0)
Alkaline Phosphatase: 260 U/L — ABNORMAL HIGH (ref 38–126)
Bilirubin, Direct: <0.1 mg/dL (ref 0.0–0.2)
Total Bilirubin: 0.3 mg/dL (ref 0.3–1.2)
Total Protein: 6 g/dL — ABNORMAL LOW (ref 6.5–8.1)

## 2022-09-03 LAB — DIC (DISSEMINATED INTRAVASCULAR COAGULATION)PANEL
D-Dimer, Quant: >20 ug/mL-FEU — ABNORMAL HIGH (ref 0.00–0.50)
D-Dimer, Quant: >20 ug/mL-FEU — ABNORMAL HIGH (ref 0.00–0.50)
Fibrinogen: 194 mg/dL — ABNORMAL LOW (ref 210–475)
Fibrinogen: 211 mg/dL (ref 210–475)
INR: 1.3 — ABNORMAL HIGH (ref 0.8–1.2)
INR: 1.2 (ref 0.8–1.2)
Platelets: 101 10*3/uL — ABNORMAL LOW (ref 150–400)
Platelets: 92 10*3/uL — ABNORMAL LOW (ref 150–400)
Prothrombin Time: 16.2 seconds — ABNORMAL HIGH (ref 11.4–15.2)
Prothrombin Time: 15.6 seconds — ABNORMAL HIGH (ref 11.4–15.2)
Smear Review: NONE SEEN
Smear Review: NONE SEEN
aPTT: 31 seconds (ref 24–36)
aPTT: 31 seconds (ref 24–36)

## 2022-09-03 LAB — PROTIME-INR
INR: 1.3 — ABNORMAL HIGH (ref 0.8–1.2)
Prothrombin Time: 16.5 seconds — ABNORMAL HIGH (ref 11.4–15.2)

## 2022-09-03 LAB — LACTIC ACID, PLASMA: Lactic Acid, Venous: 4.8 mmol/L (ref 0.5–1.9)

## 2022-09-03 LAB — PREPARE RBC (CROSSMATCH)

## 2022-09-03 LAB — ABO/RH: ABO/RH(D): O POS

## 2022-09-03 LAB — PROCALCITONIN: Procalcitonin: 0.19 ng/mL

## 2022-09-03 LAB — TSH: TSH: 2.366 u[IU]/mL (ref 0.350–4.500)

## 2022-09-03 LAB — FIBRINOGEN: Fibrinogen: 142 mg/dL — ABNORMAL LOW (ref 210–475)

## 2022-09-03 SURGERY — Surgical Case
Anesthesia: General

## 2022-09-03 MED ORDER — SODIUM CHLORIDE 0.9% IV SOLUTION: Freq: Once | INTRAVENOUS | Status: DC

## 2022-09-03 MED ORDER — SODIUM CHLORIDE 0.9 % IV SOLN
INTRAVENOUS | Status: DC | PRN
Start: 2022-09-03 — End: 2022-09-04

## 2022-09-03 MED ORDER — OXYTOCIN-SODIUM CHLORIDE 30-0.9 UT/500ML-% IV SOLN: 1.0000 m[IU]/min | INTRAVENOUS | Status: DC

## 2022-09-03 MED ORDER — ACETAMINOPHEN 10 MG/ML IV SOLN
INTRAVENOUS | Status: AC
  Filled 2022-09-03: qty 100

## 2022-09-03 MED ORDER — LACTATED RINGERS IV SOLN: 125.0000 mL/h | INTRAVENOUS | Status: AC

## 2022-09-03 MED ORDER — ROCURONIUM BROMIDE 100 MG/10ML IV SOLN
INTRAVENOUS | Status: DC | PRN
  Administered 2022-09-03: 70 mg via INTRAVENOUS

## 2022-09-03 MED ORDER — OXYTOCIN BOLUS FROM INFUSION: 333.0000 mL | Freq: Once | INTRAVENOUS | Status: DC

## 2022-09-03 MED ORDER — ACETAMINOPHEN 10 MG/ML IV SOLN
INTRAVENOUS | Status: DC | PRN
  Administered 2022-09-03: 1000 mg via INTRAVENOUS

## 2022-09-03 MED ORDER — ONDANSETRON HCL 4 MG/2ML IJ SOLN: 4.0000 mg | Freq: Four times a day (QID) | INTRAMUSCULAR | Status: DC | PRN

## 2022-09-03 MED ORDER — FENTANYL CITRATE (PF) 100 MCG/2ML IJ SOLN
INTRAMUSCULAR | Status: AC
  Filled 2022-09-03: qty 2

## 2022-09-03 MED ORDER — ONDANSETRON HCL 4 MG/2ML IJ SOLN
INTRAMUSCULAR | Status: DC | PRN
  Administered 2022-09-03: 4 mg via INTRAVENOUS

## 2022-09-03 MED ORDER — OXYTOCIN-SODIUM CHLORIDE 30-0.9 UT/500ML-% IV SOLN
2.5000 [IU]/h | INTRAVENOUS | Status: DC
  Administered 2022-09-03: 30 [IU] via INTRAVENOUS
  Administered 2022-09-04: 2.5 [IU]/h via INTRAVENOUS
  Filled 2022-09-03: qty 500

## 2022-09-03 MED ORDER — TRANEXAMIC ACID-NACL 1000-0.7 MG/100ML-% IV SOLN
INTRAVENOUS | Status: DC | PRN
  Administered 2022-09-03: 1000 mg via INTRAVENOUS

## 2022-09-03 MED ORDER — SODIUM CHLORIDE 0.9 % IV SOLN
2.0000 g | Freq: Four times a day (QID) | INTRAVENOUS | Status: AC
  Administered 2022-09-03 – 2022-09-05 (×6): 2 g via INTRAVENOUS
  Filled 2022-09-03 (×7): qty 2000

## 2022-09-03 MED ORDER — TRANEXAMIC ACID-NACL 1000-0.7 MG/100ML-% IV SOLN
INTRAVENOUS | Status: AC
  Filled 2022-09-03: qty 100

## 2022-09-03 MED ORDER — BUPIVACAINE HCL (PF) 0.25 % IJ SOLN
INTRAMUSCULAR | Status: AC
  Filled 2022-09-03: qty 60

## 2022-09-03 MED ORDER — ETOMIDATE 2 MG/ML IV SOLN
INTRAVENOUS | Status: DC | PRN
  Administered 2022-09-03: 20 mg via INTRAVENOUS

## 2022-09-03 MED ORDER — DIPHENHYDRAMINE HCL 50 MG/ML IJ SOLN: 12.5000 mg | Freq: Four times a day (QID) | INTRAMUSCULAR | Status: DC | PRN

## 2022-09-03 MED ORDER — HYDRALAZINE HCL 20 MG/ML IJ SOLN
INTRAMUSCULAR | Status: AC
  Filled 2022-09-03: qty 1

## 2022-09-03 MED ORDER — OXYTOCIN-SODIUM CHLORIDE 30-0.9 UT/500ML-% IV SOLN
1.0000 m[IU]/min | INTRAVENOUS | Status: DC
  Administered 2022-09-03: 4 m[IU]/min via INTRAVENOUS
  Filled 2022-09-03: qty 500

## 2022-09-03 MED ORDER — MISOPROSTOL 200 MCG PO TABS
ORAL_TABLET | ORAL | Status: AC
  Filled 2022-09-03: qty 3

## 2022-09-03 MED ORDER — SOD CITRATE-CITRIC ACID 500-334 MG/5ML PO SOLN
ORAL | Status: AC
  Filled 2022-09-03: qty 15

## 2022-09-03 MED ORDER — SOD CITRATE-CITRIC ACID 500-334 MG/5ML PO SOLN: 30.0000 mL | ORAL | Status: DC | PRN

## 2022-09-03 MED ORDER — OXYTOCIN-SODIUM CHLORIDE 30-0.9 UT/500ML-% IV SOLN
INTRAVENOUS | Status: AC
  Filled 2022-09-03: qty 500

## 2022-09-03 MED ORDER — CALCIUM CHLORIDE 10 % IV SOLN
INTRAVENOUS | Status: AC
  Filled 2022-09-03: qty 10

## 2022-09-03 MED ORDER — DIPHENHYDRAMINE HCL 12.5 MG/5ML PO ELIX: 12.5000 mg | ORAL_SOLUTION | Freq: Four times a day (QID) | ORAL | Status: DC | PRN

## 2022-09-03 MED ORDER — CEFAZOLIN SODIUM-DEXTROSE 2-4 GM/100ML-% IV SOLN: 2.0000 g | INTRAVENOUS | Status: DC

## 2022-09-03 MED ORDER — LABETALOL HCL 5 MG/ML IV SOLN
INTRAVENOUS | Status: AC
  Administered 2022-09-04: 20 mg
  Filled 2022-09-03: qty 32

## 2022-09-03 MED ORDER — SODIUM CHLORIDE (PF) 0.9 % IJ SOLN
INTRAMUSCULAR | Status: AC
  Filled 2022-09-03: qty 50

## 2022-09-03 MED ORDER — CALCIUM CHLORIDE 10 % IV SOLN
INTRAVENOUS | Status: DC | PRN
  Administered 2022-09-03: 1 g via INTRAVENOUS

## 2022-09-03 MED ORDER — FENTANYL CITRATE (PF) 100 MCG/2ML IJ SOLN
50.0000 ug | INTRAMUSCULAR | Status: DC | PRN
  Administered 2022-09-03 (×4): 100 ug via INTRAVENOUS
  Filled 2022-09-03: qty 2

## 2022-09-03 MED ORDER — SODIUM CHLORIDE 0.9% FLUSH: 9.0000 mL | INTRAVENOUS | Status: DC | PRN

## 2022-09-03 MED ORDER — CALCIUM CARBONATE ANTACID 500 MG PO CHEW: 2.0000 | CHEWABLE_TABLET | ORAL | Status: DC | PRN

## 2022-09-03 MED ORDER — INSULIN ASPART 100 UNIT/ML IV SOLN
10.0000 [IU] | Freq: Once | INTRAVENOUS | Status: AC
  Administered 2022-09-03: 10 [IU] via INTRAVENOUS
  Filled 2022-09-03: qty 0.1

## 2022-09-03 MED ORDER — DEXTROSE 50 % IV SOLN
25.0000 mL | Freq: Once | INTRAVENOUS | Status: AC
  Administered 2022-09-03: 25 mL via INTRAVENOUS

## 2022-09-03 MED ORDER — FENTANYL CITRATE (PF) 100 MCG/2ML IJ SOLN
50.0000 ug | INTRAMUSCULAR | Status: DC | PRN
  Administered 2022-09-04: 100 ug via INTRAVENOUS
  Filled 2022-09-03 (×4): qty 2

## 2022-09-03 MED ORDER — LACTATED RINGERS IV SOLN: INTRAVENOUS | Status: DC

## 2022-09-03 MED ORDER — ACETAMINOPHEN 325 MG PO TABS: 650.0000 mg | ORAL_TABLET | ORAL | Status: DC | PRN

## 2022-09-03 MED ORDER — METHYLERGONOVINE MALEATE 0.2 MG/ML IJ SOLN
INTRAMUSCULAR | Status: AC
  Filled 2022-09-03: qty 1

## 2022-09-03 MED ORDER — CARBOPROST TROMETHAMINE 250 MCG/ML IM SOLN
INTRAMUSCULAR | Status: DC | PRN
Start: 2022-09-03 — End: 2022-09-04
  Administered 2022-09-03: 250 ug via INTRAMUSCULAR

## 2022-09-03 MED ORDER — HYDROMORPHONE 1 MG/ML IV SOLN
INTRAVENOUS | Status: DC
  Administered 2022-09-03: 30 mg via INTRAVENOUS
  Filled 2022-09-03 (×5): qty 30

## 2022-09-03 MED ORDER — AMMONIA AROMATIC IN INHA
RESPIRATORY_TRACT | Status: AC
  Filled 2022-09-03: qty 10

## 2022-09-03 MED ORDER — ONDANSETRON HCL 4 MG/2ML IJ SOLN
INTRAMUSCULAR | Status: AC
  Filled 2022-09-03: qty 2

## 2022-09-03 MED ORDER — MIDAZOLAM HCL 2 MG/2ML IJ SOLN
INTRAMUSCULAR | Status: AC
  Filled 2022-09-03: qty 2

## 2022-09-03 MED ORDER — LIDOCAINE HCL (PF) 1 % IJ SOLN: 30.0000 mL | INTRAMUSCULAR | Status: DC | PRN

## 2022-09-03 MED ORDER — DEXAMETHASONE SODIUM PHOSPHATE 4 MG/ML IJ SOLN
INTRAMUSCULAR | Status: DC | PRN
  Administered 2022-09-03: 4 mg via INTRAVENOUS

## 2022-09-03 MED ORDER — DEXTROSE 50 % IV SOLN
INTRAVENOUS | Status: AC
  Filled 2022-09-03: qty 50

## 2022-09-03 MED ORDER — SOD CITRATE-CITRIC ACID 500-334 MG/5ML PO SOLN
ORAL | Status: AC
  Administered 2022-09-03: 30 mL via ORAL
  Filled 2022-09-03: qty 15

## 2022-09-03 MED ORDER — LACTATED RINGERS IV SOLN: 500.0000 mL | INTRAVENOUS | Status: DC | PRN

## 2022-09-03 MED ORDER — MIDAZOLAM HCL 5 MG/5ML IJ SOLN
INTRAMUSCULAR | Status: DC | PRN
  Administered 2022-09-03: 2 mg via INTRAVENOUS

## 2022-09-03 MED ORDER — FENTANYL CITRATE (PF) 100 MCG/2ML IJ SOLN
INTRAMUSCULAR | Status: DC | PRN
  Administered 2022-09-03: 50 ug via INTRAVENOUS
  Administered 2022-09-03: 100 ug via INTRAVENOUS

## 2022-09-03 MED ORDER — PRENATAL MULTIVITAMIN CH: 1.0000 | ORAL_TABLET | Freq: Every day | ORAL | Status: DC

## 2022-09-03 MED ORDER — SOD CITRATE-CITRIC ACID 500-334 MG/5ML PO SOLN: 30.0000 mL | ORAL | Status: DC

## 2022-09-03 MED ORDER — TERBUTALINE SULFATE 1 MG/ML IJ SOLN: 0.2500 mg | Freq: Once | INTRAMUSCULAR | Status: DC | PRN

## 2022-09-03 MED ORDER — SUGAMMADEX SODIUM 200 MG/2ML IV SOLN
INTRAVENOUS | Status: DC | PRN
  Administered 2022-09-03: 200 mg via INTRAVENOUS

## 2022-09-03 MED ORDER — OXYTOCIN 10 UNIT/ML IJ SOLN
INTRAMUSCULAR | Status: AC
  Filled 2022-09-03: qty 2

## 2022-09-03 MED ORDER — GENTAMICIN SULFATE 40 MG/ML IJ SOLN
5.0000 mg/kg | INTRAVENOUS | Status: DC
  Administered 2022-09-03 – 2022-09-05 (×3): 460 mg via INTRAVENOUS
  Filled 2022-09-03 (×3): qty 11.5

## 2022-09-03 MED ORDER — CARBOPROST TROMETHAMINE 250 MCG/ML IM SOLN
INTRAMUSCULAR | Status: AC
  Filled 2022-09-03: qty 1

## 2022-09-03 MED ORDER — LIDOCAINE HCL (CARDIAC) PF 100 MG/5ML IV SOSY
PREFILLED_SYRINGE | INTRAVENOUS | Status: DC | PRN
  Administered 2022-09-03: 80 mg via INTRAVENOUS

## 2022-09-03 MED ORDER — SODIUM CHLORIDE 0.9 % IV SOLN: 500.0000 mg | INTRAVENOUS | Status: DC

## 2022-09-03 MED ORDER — PHENYLEPHRINE HCL-NACL 20-0.9 MG/250ML-% IV SOLN
INTRAVENOUS | Status: AC
  Filled 2022-09-03: qty 250

## 2022-09-03 MED ORDER — NALOXONE HCL 0.4 MG/ML IJ SOLN: 0.4000 mg | INTRAMUSCULAR | Status: DC | PRN

## 2022-09-03 MED ORDER — MISOPROSTOL 50MCG HALF TABLET
100.0000 ug | ORAL_TABLET | ORAL | Status: AC
  Administered 2022-09-03: 100 ug via VAGINAL
  Filled 2022-09-03: qty 2

## 2022-09-03 MED ORDER — LIDOCAINE HCL (PF) 1 % IJ SOLN
INTRAMUSCULAR | Status: AC
  Filled 2022-09-03: qty 30

## 2022-09-03 SURGICAL SUPPLY — 31 items
APL PRP STRL LF DISP 70% ISPRP (MISCELLANEOUS) ×1
CHLORAPREP W/TINT 26 (MISCELLANEOUS) ×1 IMPLANT
DRSG TELFA 3X8 NADH STRL (GAUZE/BANDAGES/DRESSINGS) ×1 IMPLANT
ELECT REM PT RETURN 9FT ADLT (ELECTROSURGICAL) ×1
ELECTRODE REM PT RTRN 9FT ADLT (ELECTROSURGICAL) ×1 IMPLANT
GAUZE SPONGE 4X4 12PLY STRL (GAUZE/BANDAGES/DRESSINGS) ×1 IMPLANT
GOWN STRL REUS W/ TWL LRG LVL3 (GOWN DISPOSABLE) ×3 IMPLANT
GOWN STRL REUS W/TWL LRG LVL3 (GOWN DISPOSABLE) ×3
HEMOSTAT SURGICEL 2X3 (HEMOSTASIS) IMPLANT
KIT PREVENA INCISION MGT20CM45 (CANNISTER) IMPLANT
MANIFOLD NEPTUNE II (INSTRUMENTS) ×1 IMPLANT
MAT PREVALON FULL STRYKER (MISCELLANEOUS) ×1 IMPLANT
NDL HYPO 25GX1X1/2 BEV (NEEDLE) ×1 IMPLANT
NEEDLE HYPO 25GX1X1/2 BEV (NEEDLE) ×1 IMPLANT
NS IRRIG 1000ML POUR BTL (IV SOLUTION) ×1 IMPLANT
PACK C SECTION AR (MISCELLANEOUS) ×1 IMPLANT
PAD OB MATERNITY 4.3X12.25 (PERSONAL CARE ITEMS) ×1 IMPLANT
PAD PREP OB/GYN DISP 24X41 (PERSONAL CARE ITEMS) ×1 IMPLANT
SCRUB CHG 4% DYNA-HEX 4OZ (MISCELLANEOUS) ×1 IMPLANT
STAPLER INSORB 30 2030 C-SECTI (MISCELLANEOUS) IMPLANT
SUT MNCRL 4-0 (SUTURE) ×1
SUT MNCRL 4-0 27XMFL (SUTURE) ×1
SUT VIC AB 0 CT1 36 (SUTURE) ×2 IMPLANT
SUT VIC AB 0 CTX 36 (SUTURE) ×2
SUT VIC AB 0 CTX36XBRD ANBCTRL (SUTURE) ×2 IMPLANT
SUT VIC AB 2-0 SH 27 (SUTURE) ×2
SUT VIC AB 2-0 SH 27XBRD (SUTURE) ×2 IMPLANT
SUTURE MNCRL 4-0 27XMF (SUTURE) ×1 IMPLANT
SYR 30ML LL (SYRINGE) ×2 IMPLANT
TRAP FLUID SMOKE EVACUATOR (MISCELLANEOUS) ×1 IMPLANT
WATER STERILE IRR 500ML POUR (IV SOLUTION) ×1 IMPLANT

## 2022-09-03 NOTE — Progress Notes (Signed)
Bleeding has slowed, patient comfortable with PCA. Currently on 2nd unit of pRBCs, cryo, plts and FFP pending. Will recheck DIC panel q6hrs at minimum, and 30 min after delivery of cryo.  Continue to monitor for worsening clinical status. Have discussed with anesthesia team and pt does have 2 ivs, planned transfusion to address coagulopathy now, and we will proceed with c/s under general with TAP block if needed.

## 2022-09-03 NOTE — Consult Note (Incomplete)
NAME:  Savannah Hoffman, MRN:  [id number], DOB:  [birth year], LOS: 0 ADMISSION DATE:  09/03/2022, CONSULTATION DATE: 09/03/2022 REFERRING MD: Dr. Dalbert Garnet, CHIEF COMPLAINT: IUFD and likely Placenta Abruption  History of Present Illness:  This is a 27 yo female G1P0 [redacted]w[redacted]d who presented to [facility] on 08/7 with worsening abdominal pain onset the night of 08/6 that intensified the morning of 08/7.  Upon arrival stat ultrasound revealed intrauterine fetal demise.  Pt subsequently admitted to the labor and delivery unit to start induction.  Pt later developed vaginal bleeding with orthostatic sx while going to the restroom and worsening abdominal pain.  Vital signs remained stable.  Significant lab results were: wbc 22.6/hgb 7.5/platelet count 141/fibrinogen 142/PT 16.5/inr 1.3 based on lab results placental abruption suspected.  Per OBGYN orders placed to administer 2 units of pRBC's/cryo/platelets with plans to still proceed with vaginal delivery unless pt became clinically unstable.  Dilaudid PCA started for pain control.  A cooke catheter was placed, inflated to 80 ml and 100 mcg vaginal cytotec placed which resulted in slowing of vaginal bleeding.    Pt received 1 unit of pRBC's/2 units of cryo/1 unit of platelets.  However, repeat labs revealed worsening DIC (hgb 6.3/platelets 101/d-dimer >20.00/fibrinogen 194/PT 16.2/INR 1.3) massive blood transfusion protocol initiated.  Pt not delivering fast enough to proceed with vaginal delivery, therefore pt transported for emergent C section.  PCCM team consulted to assist with management postop.    Pertinent  Medical History  COVID-19 Pre-Diabetes  Vitamin D Deficiency  Significant Hospital Events: Including procedures, antibiotic start and stop dates in addition to other pertinent events   08/7: Pt admitted to the labor and delivery unit with intrauterine fetal demise; DIC; and suspected placental abruption requiring transfer to ICU post emergent C section    Interim History / Subjective:  ***  Objective   Blood pressure (!) 151/91, pulse (!) 121, temperature 100.2 F (37.9 C), temperature source Axillary, resp. rate 20, weight 91 kg, last menstrual period 01/01/2022, SpO2 98%.        Intake/Output Summary (Last 24 hours) at 09/03/2022 2133 Last data filed at 09/03/2022 2100 Gross per 24 hour  Intake 950.33 ml  Output --  Net 950.33 ml   Filed Weights   09/03/22 2005  Weight: 91 kg    Examination: General: *** HENT: *** Lungs: *** Cardiovascular: *** Abdomen: *** Extremities: *** Neuro: *** GU: ***  Resolved Hospital Problem list     Assessment & Plan:  #Intra-operative mechanical intubation for airway protection during emergent C section - Full vent support for now: vent settings reviewed and established  - Continue lung protective strategies  - Maintain plateau pressures less than 30 cm H20 - VAP bundle implemented  - SBT once all parameters met - Intermittent CXR and ABG's - Maintain RASS goal of 0 to -1  - PAD protocol to maintain RASS goal: propofol and fentanyl gtts  - WUA daily   #Acute blood loss anemia  #IUFD and placenta abruption  #DIC - Serial CBC's and coags q4hrs  - Monitor for s/sx of bleeding  - Transfuse for hgb <8 and/or active signs of bleeding  - Aggressive blood product/iv fluid resuscitation and/or prn levophed gtt to maintain map >65  #Acute kidney injury with hyperkalemia and metabolic acidosis secondary to ATN #Lactic acidosis  - Trend BMP and lactic acid - Replace electrolytes as indicated  - Monitor UOP - Avoid nephrotoxic medications  #Sepsis suspect secondary to intraabdominal infection  - Trend  WBC and monitor fever curve  - Trend PCT   - Will check cultures - Continue abx as outlined above pending culture results and sensitivities    #Intrauterine fetal demise at 39w0days  - OBGYN following appreciate input  - Provide support care  - Chaplain as needed   Best Practice  (right click and "Reselect all SmartList Selections" daily)   Diet/type: NPO DVT prophylaxis: SCD GI prophylaxis: H2B Lines: Central line Foley:  Yes, and it is still needed Code Status:  full code Last date of multidisciplinary goals of care discussion [N/A]  Labs   CBC: Recent Labs  Lab 09/03/22 0843 09/03/22 1149 09/03/22 1233 09/03/22 1709 09/03/22 2000  WBC 17.5* 20.7* 22.6* 26.8*  --   NEUTROABS 13.3*  --   --   --   --   HGB 9.8* 7.5* 7.5* 6.9* 6.3*  HCT 30.1* 23.2* 22.4* 20.4* 18.4*  MCV 82.7 83.8 82.1 81.0  --   PLT 181 150 141* 104* 101*    Basic Metabolic Panel: Recent Labs  Lab 09/03/22 1009 09/03/22 2000  NA 132* 135  K 3.6 5.5*  CL 109 109  CO2 18* 15*  GLUCOSE 84 116*  BUN 12 19  CREATININE 0.54 1.34*  CALCIUM 7.9* 7.6*   GFR: CrCl cannot be calculated (Unknown ideal weight.). Recent Labs  Lab 09/03/22 0843 09/03/22 1149 09/03/22 1233 09/03/22 1709 09/03/22 2000  WBC 17.5* 20.7* 22.6* 26.8*  --   LATICACIDVEN  --   --   --   --  4.8*    Liver Function Tests: Recent Labs  Lab 09/03/22 1009  AST 39  ALT 16  ALKPHOS 147*  BILITOT 0.3  PROT 6.0*  ALBUMIN 2.8*   No results for input(s): "LIPASE", "AMYLASE" in the last 168 hours. No results for input(s): "AMMONIA" in the last 168 hours.  ABG No results found for: "PHART", "PCO2ART", "PO2ART", "HCO3", "TCO2", "ACIDBASEDEF", "O2SAT"   Coagulation Profile: Recent Labs  Lab 09/03/22 1233 09/03/22 2000  INR 1.3* 1.3*    Cardiac Enzymes: No results for input(s): "CKTOTAL", "CKMB", "CKMBINDEX", "TROPONINI" in the last 168 hours.  HbA1C: Hgb A1c MFr Bld  Date/Time Value Ref Range Status  09/03/2022 10:09 AM 5.5 4.8 - 5.6 % Final    Comment:    (NOTE) Pre diabetes:          5.7%-6.4%  Diabetes:              >6.4%  Glycemic control for   <7.0% adults with diabetes   03/26/2021 06:53 AM 5.5 4.8 - 5.6 % Final    Comment:    (NOTE) Pre diabetes:           5.7%-6.4%  Diabetes:              >6.4%  Glycemic control for   <7.0% adults with diabetes     CBG: No results for input(s): "GLUCAP" in the last 168 hours.  Review of Systems:   Unable to assess pt mechanically intubated   Past Medical History:  She,  has a past medical history of COVID (12/2019), Pre-diabetes, and Vitamin D deficiency (03/27/2021).   Surgical History:   Past Surgical History:  Procedure Laterality Date   ORIF TIBIA PLATEAU Left 03/26/2021   Procedure: OPEN REDUCTION INTERNAL FIXATION (ORIF) TIBIAL PLATEAU;  Surgeon: Myrene Galas, MD;  Location: [facility] OR;  Service: Orthopedics;  Laterality: Left;   WISDOM TOOTH EXTRACTION       Social History:  reports that she has never smoked. She has never used smokeless tobacco. She reports that she does not currently use alcohol. She reports that she does not use drugs.   Family History:  Her family history is not on file.   Allergies No Known Allergies   Home Medications  Prior to Admission medications   Medication Sig Start Date End Date Taking? Authorizing Provider  acetaminophen (TYLENOL) 500 MG tablet Take 1 tablet (500 mg total) by mouth every 12 (twelve) hours. 03/27/21   Montez Morita, PA-C  ascorbic acid (VITAMIN C) 1000 MG tablet Take 1 tablet (1,000 mg total) by mouth daily. 03/27/21   Montez Morita, PA-C  Cholecalciferol (VITAMIN D) 50 MCG (2000 UT) tablet Take 2.5 tablets (5,000 Units total) by mouth daily. 03/27/21   Montez Morita, PA-C  docusate sodium (COLACE) 100 MG capsule Take 1 capsule (100 mg total) by mouth 2 (two) times daily. 03/27/21   Montez Morita, PA-C  escitalopram (LEXAPRO) 10 MG tablet Take 1 tablet (10 mg total) by mouth once daily 07/11/22     methocarbamol (ROBAXIN) 500 MG tablet Take 1-2 tablets (500-1,000 mg total) by mouth every 6 (six) hours as needed for muscle spasms. 03/27/21   Montez Morita, PA-C  metroNIDAZOLE (FLAGYL) 500 MG tablet Take 1 tablet (500 mg total) by mouth 2 (two) times daily for 7  days 08/22/22     ondansetron (ZOFRAN) 4 MG tablet Take 1 tablet (4 mg total) by mouth every 8 (eight) hours as needed for Nausea 04/18/22     oxyCODONE-acetaminophen (PERCOCET/ROXICET) 5-325 MG tablet Take 1-2 tablets by mouth every 6 (six) hours as needed for severe pain or moderate pain. 03/27/21   Montez Morita, PA-C  Prenatal 27-1 MG TABS Take 1 tablet by mouth daily. 03/26/22     Prenatal 27-1 MG TABS Take 1 tablet by mouth daily. 05/16/22     Prenatal 28-0.8 MG TABS Take 1 tablet by mouth once daily 07/03/22     Rivaroxaban (XARELTO) 15 MG TABS tablet Take 1 tablet (15 mg total) by mouth daily with supper. 03/27/21 04/26/21  Montez Morita, PA-C  Vitamin D, Ergocalciferol, (DRISDOL) 1.25 MG (50000 UNIT) CAPS capsule Take 1 capsule (50,000 Units total) by mouth every 7 (seven) days. 03/27/21   Montez Morita, PA-C  Zinc Sulfate 220 (50 Zn) MG TABS Take 1 tablet (220 mg total) by mouth daily. 03/27/21   Montez Morita, PA-C     Critical care time: ***

## 2022-09-03 NOTE — OB Triage Note (Signed)
Pt presents to labor and delivery with complaints of severe abdominal pain at 8:40am. Pt is visibly tearful and states that her pain began last night intermittently. Pt stated that this morning she felt baby move one time and then had constant, severe pain. CNM performed bedside ultrasound at 8:42 where she was unable to visualize fetal heart clearly through ribcage. CNM ordered STAT bedside ultrasound. Ultrasound confirmed at 8:58am that there are no fetal heart tones present. MD informed pt that no fetal heart tones are present. L&D team awaiting full report regarding ultrasound.

## 2022-09-03 NOTE — Progress Notes (Signed)
Labor Progress Note  Arlethia Boch is a 27 y.o. G1P0 at [redacted]w[redacted]d by LMP admitted for IUFD and placenta abruption  Subjective: she is receiving some relief from the Dilaudid PCA, reports mild dizziness, partner is at bedside  Objective: BP (!) 147/96   Pulse 78   Temp 99.1 F (37.3 C) (Axillary)   Resp 15   LMP 01/01/2022 (Exact Date)   SpO2 100%  Vitals:   09/03/22 1357 09/03/22 1429  BP: (!) 132/91 (!) 147/96  Pulse: 88 78  Resp: 15   Temp: 99.1 F (37.3 C)   SpO2:      Notable VS details: reviewed  UC:   difficulty tracing SVE:    Dilation: 3cm  Effacement: 50%  Station:  -2  Consistency: medium  Position: middle  Membrane status:intact Amniotic color: n/a  Labs: Lab Results  Component Value Date   WBC 22.6 (H) 09/03/2022   HGB 7.5 (L) 09/03/2022   HCT 22.4 (L) 09/03/2022   MCV 82.1 09/03/2022   PLT 141 (L) 09/03/2022    Assessment / Plan: 27 year old G1P0 at [redacted]w[redacted]d here for IOL for IUFD and placenta abruption - Dr. Dalbert Garnet updated on labor progress and bleeding  Labor:  Glendell Docker catheter placed with mild difficulty, inflated to 80ml. vaginal cytotec placed. She tolerated it well.  Bleeding has slowed down and minimal blood on exam or Cooke catheter placement. She is receiving PRBCs at this time. Preeclampsia:  labs stable Pain Control:   Dilauded PCA I/D:   afebrile. WBCs 22.6 Anticipated MOD:  NSVD  Janyce Llanos, CNM 09/03/2022, 3:05 PM

## 2022-09-03 NOTE — Anesthesia Procedure Notes (Signed)
Procedure Name: Intubation Date/Time: 09/03/2022 10:36 PM  Performed by: Elmarie Mainland, CRNAPre-anesthesia Checklist: Patient identified, Emergency Drugs available, Suction available and Patient being monitored Patient Re-evaluated:Patient Re-evaluated prior to induction Oxygen Delivery Method: Circle system utilized Preoxygenation: Pre-oxygenation with 100% oxygen Induction Type: IV induction, Rapid sequence and Cricoid Pressure applied Laryngoscope Size: McGraph and 3 Grade View: Grade I Tube type: Oral Tube size: 6.5 mm Number of attempts: 1 Airway Equipment and Method: Video-laryngoscopy and Stylet Placement Confirmation: ETT inserted through vocal cords under direct vision, positive ETCO2 and breath sounds checked- equal and bilateral Secured at: 23 cm Tube secured with: Tape Dental Injury: Teeth and Oropharynx as per pre-operative assessment

## 2022-09-03 NOTE — Progress Notes (Signed)
Ultrasound dept notified to come STAT for bedside US dt difficulty locating FHT. [place] dept en route.

## 2022-09-03 NOTE — Anesthesia Procedure Notes (Addendum)
Arterial Line Insertion Start/End8/07/2022 10:02 PM, 09/03/2022 10:05 PM Performed by: Foye Deer, MD, anesthesiologist  Patient location: Pre-op. Preanesthetic checklist: patient identified, IV checked, site marked, risks and benefits discussed, surgical consent, monitors and equipment checked, pre-op evaluation, timeout performed and anesthesia consent Left, radial was placed Catheter size: 20 G Hand hygiene performed  and maximum sterile barriers used   Attempts: 1 Procedure performed using ultrasound guided technique. Ultrasound Notes:anatomy identified, needle tip was noted to be adjacent to the nerve/plexus identified and no ultrasound evidence of intravascular and/or intraneural injection Following insertion, dressing applied. Post procedure assessment: normal and unchanged  Patient tolerated the procedure well with no immediate complications.

## 2022-09-03 NOTE — Anesthesia Preprocedure Evaluation (Signed)
Anesthesia Evaluation  Patient identified by MRN, date of birth, ID band Patient confused    Reviewed: Allergy & Precautions, H&P , NPO status , Patient's Chart, lab work & pertinent test results  Airway Mallampati: III  TM Distance: >3 FB Neck ROM: full    Dental no notable dental hx.    Pulmonary neg pulmonary ROS   Pulmonary exam normal        Cardiovascular  Rhythm:Regular Rate:Tachycardia     Neuro/Psych negative neurological ROS  negative psych ROS   GI/Hepatic negative GI ROS, Neg liver ROS,,,  Endo/Other  negative endocrine ROS    Renal/GU      Musculoskeletal   Abdominal  (+) + obese  Peds  Hematology  (+) Blood dyscrasia, anemia DIC   Anesthesia Other Findings Pt with fetal demise with placental abruption and now in DIC. Blood products ordered by Dr. Dalbert Garnet. Epidural was not advised due to coagulopathy. Anesthesia is here to assist and prepare for possible cesarean section. Labs are being ordered and followed. Pt is somnolent with HM PCA. She has become tachycardic. Nursing staff report small blood loss, 50cc over a few hours.   UPDATE: Pt with continued coagulopathy. Pt has an elevated WBC and is being treated with empiric antibiotics. Pt has lactic acid elevation. Dr. Dalbert Garnet will perform a cesarean section. Pt has hyperkalemia at 5.5 that will be treated with insulin and glucose now due to the likely increase in K with continued resuscitation.  Past Medical History: 12/2019: COVID     Comment:  and also 2021 - no symptoms, 2022 was a mild case No date: Pre-diabetes 03/27/2021: Vitamin D deficiency  Past Surgical History: 03/26/2021: ORIF TIBIA PLATEAU; Left     Comment:  Procedure: OPEN REDUCTION INTERNAL FIXATION (ORIF)               TIBIAL PLATEAU;  Surgeon: Myrene Galas, MD;  Location:               [facility] OR;  Service: Orthopedics;  Laterality: Left; No date: WISDOM TOOTH EXTRACTION      Reproductive/Obstetrics negative OB ROS                             Anesthesia Physical Anesthesia Plan  ASA: 4  Anesthesia Plan: General ETT   Post-op Pain Management: Regional block* and Ofirmev IV (intra-op)*   Induction: Intravenous and Rapid sequence  PONV Risk Score and Plan: 4 or greater and Ondansetron, Dexamethasone and Midazolam  Airway Management Planned: Oral ETT  Additional Equipment: Arterial line  Intra-op Plan:   Post-operative Plan: Extubation in OR  Informed Consent: I have reviewed the patients History and Physical, chart, labs and discussed the procedure including the risks, benefits and alternatives for the proposed anesthesia with the patient or authorized representative who has indicated his/her understanding and acceptance.     Consent reviewed with POA  Plan Discussed with: CRNA, Anesthesiologist and Surgeon  Anesthesia Plan Comments: (GA with ETT was discussed with patient and mother. However, patient was somnolent during exam and discussion. Post-op TAP blocks were discussed with patient and partner.  Central Venous Line insertion was discussed and consent obtained from patient and mother for adequate access and the frequent lab draws needed. )        Anesthesia Quick Evaluation

## 2022-09-03 NOTE — Progress Notes (Signed)
Cervix unchanged. Have determined to proceed with delivery.  FFP hanging now. pRBCs and cryo being prepped at the bedside now and will start with 2 upRBCs. Abx just hung. Central line available.  ICU aware that she may be extubated but will need intensive monitoring with her fluid shifts while her DIC resolves.

## 2022-09-03 NOTE — Progress Notes (Signed)
Labor Progress Note  Oceane Strehle is a 27 y.o. G1P0 at [redacted]w[redacted]d by LMP admitted for IUFD and placenta abruption  Subjective: she is receiving pain relief from the Dilaudid PCA, sleeping with family at bedside  Objective: BP (!) 142/83   Pulse (!) 117   Temp 99.1 F (37.3 C) (Axillary)   Resp 11   LMP 01/01/2022 (Exact Date)   SpO2 93%  Notable VS details: reviewed  Fetal Assessment: UC:   uterine irritability, abruption pattern SVE:    Dilation: 6cm  Effacement: 70%  Station:  -2  Consistency: soft  Position: middle  Membrane status:AROM @ 1623 Amniotic color: bloody  Labs: Lab Results  Component Value Date   WBC 22.6 (H) 09/03/2022   HGB 7.5 (L) 09/03/2022   HCT 22.4 (L) 09/03/2022   MCV 82.1 09/03/2022   PLT 141 (L) 09/03/2022    Assessment / Plan: 27 year old G1P0 at [redacted]w[redacted]d here for IOL for IUFD and placenta abruption  Labor:  Progressing normally, Cooke catheter removed with ease. AROM. Will start pitocin around 1830.  Bleeding minimal. Preeclampsia:  labs stable Pain Control:   Dilaudid PCA I/D:   GBS negative Anticipated MOD:  NSVD  Janyce Llanos, CNM 09/03/2022, 5:49 PM

## 2022-09-03 NOTE — Progress Notes (Addendum)
At patient bedside for cervical exam and clinical discussion. PCA is working for pain control, as she is sleeping. She arouses easily and is alert and oriented, able to make clinical decisions. She has many people at the bedside.  Blood products given: 1 pRBC (one unit pending) 2 u of cryo given 1 unit of platelets (1 unit pending) 2 FFP pending  Because these products have taken too long to transfuse, I am activating the MTP, and anesthesia will get a central line, as an A line is not approved for L&D. The ICU does not accept pregnant patients as a rule, and has recommended that we manage her coagulopathies. I have spoken with the blood bank to remain a unit ahead. Further platelets have been ordered for arrival.  At this point, we will repeat all her labs, finish the products above, and continue with rapid replacement of products as necessary based on labs and clinical assessment.   However, I am concerned that she is not delivering more quickly. I checked her at 7:50 and her pitocin had not yet been started (she is s/p cytotec and a foley balloon), cervix 7cm with fetal head molding and -2 station. We are increasing pit infusion 4x4. I will recheck in 1 hour. If no clinical change of cervix but labs are stable, will continue for 1 more hour. If no change at that point, or if any other indication, will proceed with c-section.  Will replace calcium as needed for products.  As BP remains high, will also check preE labs. We placed a foley and will send that urine. Her creatinine is rising.   Her WBC is elevated and rising, which is consistent with clinical picture of physiologic, emotional stress, and the coagulopathy. Not febrile yet. However, will begin amp/gent and add clinda for broad coverage if c/s. Her lactic acid is also rising.

## 2022-09-03 NOTE — H&P (Signed)
OB ADMISSION/ HISTORY & PHYSICAL:  Admission Date: 09/03/2022  8:30 AM  Admit Diagnosis: IUFD  Savannah Hoffman is a 27 y.o. female presenting for abdominal pain, consistent and worsening significantly this morning.  Prenatal History: No obstetric history on file.   EDC : 10/08/22 Primary Ob Provider: [facility] Prenatal course complicated by:  - obesity - depression - rubella and varicella nonimmune  Prenatal Labs: ABO, Rh: --/--/PENDING (08/07 1610) Antibody: PENDING (08/07 0850) Rubella:   non-immune RPR:   non-reactive HBsAg:   negative HIV:   negative GTT: 114 GBS:   not collected  Medical / Surgical History :  Past medical history:  Past Medical History:  Diagnosis Date   COVID 12/2019   and also 2021 - no symptoms, 2022 was a mild case   Pre-diabetes    Vitamin D deficiency 03/27/2021     Past surgical history:  Past Surgical History:  Procedure Laterality Date   ORIF TIBIA PLATEAU Left 03/26/2021   Procedure: OPEN REDUCTION INTERNAL FIXATION (ORIF) TIBIAL PLATEAU;  Surgeon: Myrene Galas, MD;  Location: [facility] OR;  Service: Orthopedics;  Laterality: Left;   WISDOM TOOTH EXTRACTION      Family History: No family history on file.   Social History:  reports that she has never smoked. She has never used smokeless tobacco. She reports that she does not currently use alcohol. She reports that she does not use drugs.   Allergies: Patient has no known allergies.    Current Medications at time of admission:  Prior to Admission medications   Medication Sig Start Date End Date Taking? Authorizing Provider  acetaminophen (TYLENOL) 500 MG tablet Take 1 tablet (500 mg total) by mouth every 12 (twelve) hours. 03/27/21   Montez Morita, PA-C  ascorbic acid (VITAMIN C) 1000 MG tablet Take 1 tablet (1,000 mg total) by mouth daily. 03/27/21   Montez Morita, PA-C  Cholecalciferol (VITAMIN D) 50 MCG (2000 UT) tablet Take 2.5 tablets (5,000 Units total) by mouth daily. 03/27/21   Montez Morita, PA-C  docusate sodium (COLACE) 100 MG capsule Take 1 capsule (100 mg total) by mouth 2 (two) times daily. 03/27/21   Montez Morita, PA-C  escitalopram (LEXAPRO) 10 MG tablet Take 1 tablet (10 mg total) by mouth once daily 07/11/22     methocarbamol (ROBAXIN) 500 MG tablet Take 1-2 tablets (500-1,000 mg total) by mouth every 6 (six) hours as needed for muscle spasms. 03/27/21   Montez Morita, PA-C  metroNIDAZOLE (FLAGYL) 500 MG tablet Take 1 tablet (500 mg total) by mouth 2 (two) times daily for 7 days 08/22/22     ondansetron (ZOFRAN) 4 MG tablet Take 1 tablet (4 mg total) by mouth every 8 (eight) hours as needed for Nausea 04/18/22     oxyCODONE-acetaminophen (PERCOCET/ROXICET) 5-325 MG tablet Take 1-2 tablets by mouth every 6 (six) hours as needed for severe pain or moderate pain. 03/27/21   Montez Morita, PA-C  Prenatal 27-1 MG TABS Take 1 tablet by mouth daily. 03/26/22     Prenatal 27-1 MG TABS Take 1 tablet by mouth daily. 05/16/22     Prenatal 28-0.8 MG TABS Take 1 tablet by mouth once daily 07/03/22     Rivaroxaban (XARELTO) 15 MG TABS tablet Take 1 tablet (15 mg total) by mouth daily with supper. 03/27/21 04/26/21  Montez Morita, PA-C  Vitamin D, Ergocalciferol, (DRISDOL) 1.25 MG (50000 UNIT) CAPS capsule Take 1 capsule (50,000 Units total) by mouth every 7 (seven) days. 03/27/21   Renae Fickle,  Mellody Dance, PA-C  Zinc Sulfate 220 (50 Zn) MG TABS Take 1 tablet (220 mg total) by mouth daily. 03/27/21   Montez Morita, PA-C    Review of Systems: Active FM onset of abdominal pain last night that worsened and intensified this morning. bloody show not present  Physical Exam:  VS: Blood pressure (!) 151/105, pulse (!) 104, SpO2 98%.  General: alert and oriented, appears distressed, tearful, screaming in pain Heart: RRR Lungs: Clear lung fields Abdomen: Gravid, soft and non-tender, non-distended / uterus: firming up then relaxing Extremities: no edema  Genitalia / VE:  1/80/-2  FHR: No fetal heart rate  auscultated TOCO: not applied  Assessment: 27 y.o. female No obstetric history on file. Unknown with fetal demise diagnosed by formal ultrasound  Plan:  1. Induction of labor: - misoprostol vaginally q4 hrs  - toco monitoring only - vitals per unit protocol  2. Pain management:  - epidural if desired - iv stadol 1-2g q 1-2hrs PRN maternal preference. - iv toradol 15mg  q4hrs PRN maternal preference   3. Labs on admission: - CBC - KB - Parvovirus B-19 IgG and IgM - RPR - TSH - Lupus anticoagulant and anticardiolipin antibodies - Hgb A1C - CMP/P:C ratio  4. Pastoral care consult  - PRN maternal wishes  5. Plan for placental/fetal evaluation - fetal exam, photos and recommend autopsy if parental consent given - placenta to pathology - fetal karyotype ( 1x1cm chunk of placenta at base of cord. Will add additional information up to 30% of the time. Fill out paperwork and mail at room temp)  6. Ask about contraception plans 7. Offer "Now I lay me down to sleep" and the angel baby group at [facility]  Dr. Dalbert Garnet aware of admission and at bedside for time of IUFD diagnosis.  **#5 above: [employer] Chromosome Analysis, Products of Conception (POC) With Reflex to SNP Microarray (Reveal). [employer] TEST #: [id number] http://bass.[email or web address]  Aseptically obtain a small piece of tissue with subepidermal layers. Best results are obtained from placental villi, which remain viable much longer than fetal tissue. This is especially true in stillborn cases in which both cord blood and tissue samples are often not viable.   Janyce Llanos, CNM 09/03/2022 9:42 AM

## 2022-09-03 NOTE — Progress Notes (Signed)
Called to the patient bedside for difficulty finding fetal heart tones. Stat ultrasound by tech started on my arrival, confirming no fetal heart tones. AFI 5cm. Pt with significant abdominal pain, fetal movements "different" this week, last felt the baby move minimally earlier this morning.   No vaginal bleeding or leaking fluid. Abdomen is soft and non-tender.  Patient appropriately distressed, partner at bedside and also in significant distress.   Decision to start with induction briefly discussed. Pain management as desired. Labs consistent with workup for fetal demise ordered and plan for fetal chromosomes.   From review of chart, no significant risk factors. Cause unknown.

## 2022-09-03 NOTE — Anesthesia Procedure Notes (Signed)
Central Venous Catheter Insertion Performed by: Foye Deer, MD, anesthesiologist Start/End8/07/2022 8:50 PM, 09/03/2022 9:00 PM Patient location: Pre-op. Preanesthetic checklist: patient identified, IV checked, site marked, risks and benefits discussed, surgical consent, monitors and equipment checked, pre-op evaluation, timeout performed and anesthesia consent Lidocaine 1% used for infiltration and patient sedated Hand hygiene performed  and maximum sterile barriers used  Catheter size: 8 Fr Total catheter length 16. Central line was placed.Double lumen Procedure performed using ultrasound guided technique. Ultrasound Notes:anatomy identified, needle tip was noted to be adjacent to the nerve/plexus identified and no ultrasound evidence of intravascular and/or intraneural injection Attempts: 1 Following insertion, dressing applied, line sutured and Biopatch. Post procedure assessment: blood return through all ports and free fluid flow  Patient tolerated the procedure well with no immediate complications. Additional procedure comments: Ultrasound in proximal internal jugular used to visualize guide wire prior to dilation. Marland Kitchen

## 2022-09-03 NOTE — Progress Notes (Signed)
Labor Progress Note  Annelie Alber is a 27 y.o. G1P0 at [redacted]w[redacted]d by LMP admitted for IUFD and severe abdominal pain  Subjective: she reports severe constant abdominal pain and bleeding, she is crying  Objective: BP 116/73   Pulse 68   LMP 01/01/2022 (Exact Date)   SpO2 90%  Notable VS details: reviewed  UC:   difficulty tracing d/t maternal movement and thrashing in the bed SVE:    Dilation: 1cm  Effacement: Long  Station:  -2  Consistency: medium  Position: middle  Membrane status:intact Amniotic color: n/a Vaginal bleeding noted, small blood clots present  Labs: Lab Results  Component Value Date   WBC 17.5 (H) 09/03/2022   HGB 9.8 (L) 09/03/2022   HCT 30.1 (L) 09/03/2022   MCV 82.7 09/03/2022   PLT 181 09/03/2022    Assessment / Plan: 27 year old G1P0 at [redacted]w[redacted]d here with IUFD and likely placenta abruption  Labor:  called and spoke with Dr. Dalbert Garnet and updated her that patient is now experiencing vaginal bleeding and having worsening abdominal pain. Per Dr. Dalbert Garnet, check coagulopathy labs and monitor for signs of blood loss. Continue to monitor bleeding, small but steady amount of bleeding present. Preeclampsia:   labs pending Pain Control:  IV pain meds and called anesthesia for an epidural. Per anesthesia, she needs repeat CBC prior to epidural placement I/D:   WBC 17.5, afebrile, GBS status unknown Anticipated MOD:  NSVD  Janyce Llanos, CNM 09/03/2022, 11:50 AM

## 2022-09-03 NOTE — Progress Notes (Signed)
At bedside to discuss decision-making. She continues to bleed, now with orthostatic sx on going to the rest room. Vitals remain stable. Labs with evidence of dropping platelets (now 150) and hgb (9.8-->7.5).  She has poor pain control on fentanyl q hour.  Last cervical exam 1/50/-2.  Discussion with patient and family regarding safest route of delivery. Consideration of expedited delivery with c/s, given labs and sx. However, a vaginal delivery is preferred if maternal stability, and anesthesia declines epidural or spinal pain control given evolving situation.  At this time, will give 2 upRBCs, cryo and platelets, assuming coagulopathy developing in the setting of demise and suspected placental abruption. However, if vitals unstable or we are unable to continue transfusing to maintain clinical stability to attempt a quick vaginal resolution, will proceed with surgical delivery under general anesthesia.  At this time will start a Dilaudid PCA.  I did consider transfer to a tertiary care center at this time, while she is relatively stable, which may afford her a better chance at avoiding surgery and having adequate pain control. Given our current transfusion needs, I do not feel she is clinically stable for transport if it takes longer than an hour. We will monitor closely in this difficult evolving clinical situation, and remain at the bedside.

## 2022-09-03 NOTE — Progress Notes (Signed)
RN passed message from MD that pitocin should be titrated 4x4 every 15 minutes. MD will reevaluate patient's cervix in 1 hr.

## 2022-09-04 ENCOUNTER — Inpatient Hospital Stay: Payer: Managed Care, Other (non HMO)

## 2022-09-04 LAB — CBC
HCT: 23.6 % — ABNORMAL LOW (ref 36.0–46.0)
HCT: 20.9 % — ABNORMAL LOW (ref 36.0–46.0)
HCT: 20.1 % — ABNORMAL LOW (ref 36.0–46.0)
Hemoglobin: 7.9 g/dL — ABNORMAL LOW (ref 12.0–15.0)
Hemoglobin: 7.4 g/dL — ABNORMAL LOW (ref 12.0–15.0)
Hemoglobin: 7 g/dL — ABNORMAL LOW (ref 12.0–15.0)
MCH: 28.7 pg (ref 26.0–34.0)
MCH: 29.7 pg (ref 26.0–34.0)
MCH: 29.2 pg (ref 26.0–34.0)
MCHC: 33.5 g/dL (ref 30.0–36.0)
MCHC: 35.4 g/dL (ref 30.0–36.0)
MCHC: 34.8 g/dL (ref 30.0–36.0)
MCV: 85.8 fL (ref 80.0–100.0)
MCV: 83.9 fL (ref 80.0–100.0)
MCV: 83.8 fL (ref 80.0–100.0)
Platelets: 57 10*3/uL — ABNORMAL LOW (ref 150–400)
Platelets: 92 10*3/uL — ABNORMAL LOW (ref 150–400)
Platelets: 94 10*3/uL — ABNORMAL LOW (ref 150–400)
RBC: 2.75 MIL/uL — ABNORMAL LOW (ref 3.87–5.11)
RBC: 2.49 MIL/uL — ABNORMAL LOW (ref 3.87–5.11)
RBC: 2.4 MIL/uL — ABNORMAL LOW (ref 3.87–5.11)
RDW: 16.6 % — ABNORMAL HIGH (ref 11.5–15.5)
RDW: 16.2 % — ABNORMAL HIGH (ref 11.5–15.5)
RDW: 16 % — ABNORMAL HIGH (ref 11.5–15.5)
WBC: 14.6 10*3/uL — ABNORMAL HIGH (ref 4.0–10.5)
WBC: 22.8 10*3/uL — ABNORMAL HIGH (ref 4.0–10.5)
WBC: 22 10*3/uL — ABNORMAL HIGH (ref 4.0–10.5)
nRBC: 0 % (ref 0.0–0.2)
nRBC: 0 % (ref 0.0–0.2)
nRBC: 0 % (ref 0.0–0.2)

## 2022-09-04 LAB — COMPREHENSIVE METABOLIC PANEL
ALT: 15 U/L (ref 0–44)
ALT: 17 U/L (ref 0–44)
ALT: 18 U/L (ref 0–44)
ALT: 17 U/L (ref 0–44)
AST: 47 U/L — ABNORMAL HIGH (ref 15–41)
AST: 48 U/L — ABNORMAL HIGH (ref 15–41)
AST: 54 U/L — ABNORMAL HIGH (ref 15–41)
AST: 56 U/L — ABNORMAL HIGH (ref 15–41)
Albumin: 2.5 g/dL — ABNORMAL LOW (ref 3.5–5.0)
Albumin: 2.5 g/dL — ABNORMAL LOW (ref 3.5–5.0)
Albumin: 2.6 g/dL — ABNORMAL LOW (ref 3.5–5.0)
Albumin: 2.4 g/dL — ABNORMAL LOW (ref 3.5–5.0)
Alkaline Phosphatase: 196 U/L — ABNORMAL HIGH (ref 38–126)
Alkaline Phosphatase: 167 U/L — ABNORMAL HIGH (ref 38–126)
Alkaline Phosphatase: 159 U/L — ABNORMAL HIGH (ref 38–126)
Alkaline Phosphatase: 132 U/L — ABNORMAL HIGH (ref 38–126)
Anion gap: 5 (ref 5–15)
Anion gap: 8 (ref 5–15)
Anion gap: 7 (ref 5–15)
Anion gap: 8 (ref 5–15)
BUN: 16 mg/dL (ref 6–20)
BUN: 13 mg/dL (ref 6–20)
BUN: 11 mg/dL (ref 6–20)
BUN: 12 mg/dL (ref 6–20)
CO2: 21 mmol/L — ABNORMAL LOW (ref 22–32)
CO2: 20 mmol/L — ABNORMAL LOW (ref 22–32)
CO2: 21 mmol/L — ABNORMAL LOW (ref 22–32)
CO2: 23 mmol/L (ref 22–32)
Calcium: 7.9 mg/dL — ABNORMAL LOW (ref 8.9–10.3)
Calcium: 7.8 mg/dL — ABNORMAL LOW (ref 8.9–10.3)
Calcium: 7.6 mg/dL — ABNORMAL LOW (ref 8.9–10.3)
Calcium: 6.9 mg/dL — ABNORMAL LOW (ref 8.9–10.3)
Chloride: 108 mmol/L (ref 98–111)
Chloride: 106 mmol/L (ref 98–111)
Chloride: 106 mmol/L (ref 98–111)
Chloride: 107 mmol/L (ref 98–111)
Creatinine, Ser: 0.81 mg/dL (ref 0.44–1.00)
Creatinine, Ser: 0.65 mg/dL (ref 0.44–1.00)
Creatinine, Ser: 0.68 mg/dL (ref 0.44–1.00)
Creatinine, Ser: 0.66 mg/dL (ref 0.44–1.00)
GFR, Estimated: >60 mL/min (ref >60)
GFR, Estimated: >60 mL/min (ref >60)
GFR, Estimated: >60 mL/min (ref >60)
GFR, Estimated: >60 mL/min (ref >60)
Glucose, Bld: 94 mg/dL (ref 70–99)
Glucose, Bld: 108 mg/dL — ABNORMAL HIGH (ref 70–99)
Glucose, Bld: 142 mg/dL — ABNORMAL HIGH (ref 70–99)
Glucose, Bld: 100 mg/dL — ABNORMAL HIGH (ref 70–99)
Potassium: 4.4 mmol/L (ref 3.5–5.1)
Potassium: 4 mmol/L (ref 3.5–5.1)
Potassium: 3.8 mmol/L (ref 3.5–5.1)
Potassium: 3.6 mmol/L (ref 3.5–5.1)
Sodium: 134 mmol/L — ABNORMAL LOW (ref 135–145)
Sodium: 134 mmol/L — ABNORMAL LOW (ref 135–145)
Sodium: 134 mmol/L — ABNORMAL LOW (ref 135–145)
Sodium: 138 mmol/L (ref 135–145)
Total Bilirubin: 0.7 mg/dL (ref 0.3–1.2)
Total Bilirubin: 0.4 mg/dL (ref 0.3–1.2)
Total Bilirubin: 0.6 mg/dL (ref 0.3–1.2)
Total Bilirubin: 0.4 mg/dL (ref 0.3–1.2)
Total Protein: 5.2 g/dL — ABNORMAL LOW (ref 6.5–8.1)
Total Protein: 5.4 g/dL — ABNORMAL LOW (ref 6.5–8.1)
Total Protein: 5.3 g/dL — ABNORMAL LOW (ref 6.5–8.1)
Total Protein: 5.2 g/dL — ABNORMAL LOW (ref 6.5–8.1)

## 2022-09-04 LAB — FIBRINOGEN
Fibrinogen: 258 mg/dL (ref 210–475)
Fibrinogen: 316 mg/dL (ref 210–475)
Fibrinogen: 355 mg/dL (ref 210–475)

## 2022-09-04 LAB — CBC WITH DIFFERENTIAL/PLATELET
Abs Immature Granulocytes: 0.33 10*3/uL — ABNORMAL HIGH (ref 0.00–0.07)
Basophils Absolute: 0 10*3/uL (ref 0.0–0.1)
Basophils Relative: 0 %
Eosinophils Absolute: 0 10*3/uL (ref 0.0–0.5)
Eosinophils Relative: 0 %
HCT: 17.9 % — ABNORMAL LOW (ref 36.0–46.0)
Hemoglobin: 6.1 g/dL — ABNORMAL LOW (ref 12.0–15.0)
Immature Granulocytes: 2 %
Lymphocytes Relative: 10 %
Lymphs Abs: 1.9 10*3/uL (ref 0.7–4.0)
MCH: 29.2 pg (ref 26.0–34.0)
MCHC: 34.1 g/dL (ref 30.0–36.0)
MCV: 85.6 fL (ref 80.0–100.0)
Monocytes Absolute: 1.4 10*3/uL — ABNORMAL HIGH (ref 0.1–1.0)
Monocytes Relative: 7 %
Neutro Abs: 16.1 10*3/uL — ABNORMAL HIGH (ref 1.7–7.7)
Neutrophils Relative %: 81 %
Platelets: 92 10*3/uL — ABNORMAL LOW (ref 150–400)
RBC: 2.09 MIL/uL — ABNORMAL LOW (ref 3.87–5.11)
RDW: 16.2 % — ABNORMAL HIGH (ref 11.5–15.5)
WBC: 19.7 10*3/uL — ABNORMAL HIGH (ref 4.0–10.5)
nRBC: 0.2 % (ref 0.0–0.2)

## 2022-09-04 LAB — PROTEIN / CREATININE RATIO, URINE
Creatinine, Urine: 44 mg/dL
Creatinine, Urine: 17 mg/dL
Protein Creatinine Ratio: 1.48 mg/mg{Cre} — ABNORMAL HIGH (ref 0.00–0.15)
Total Protein, Urine: 65 mg/dL
Total Protein, Urine: <6 mg/dL

## 2022-09-04 LAB — PROTIME-INR
INR: 1.3 — ABNORMAL HIGH (ref 0.8–1.2)
INR: 1.1 (ref 0.8–1.2)
Prothrombin Time: 15.9 seconds — ABNORMAL HIGH (ref 11.4–15.2)
Prothrombin Time: 14.7 seconds (ref 11.4–15.2)

## 2022-09-04 LAB — PREPARE RBC (CROSSMATCH)

## 2022-09-04 LAB — PREPARE PLATELET PHERESIS
Unit division: 0
Unit division: 0

## 2022-09-04 LAB — BPAM PLATELET PHERESIS
Blood Product Expiration Date: 202408102359
Blood Product Expiration Date: 202408102359
ISSUE DATE / TIME: 202408080312
Unit Type and Rh: 5100
Unit Type and Rh: 6200

## 2022-09-04 MED ORDER — BUPIVACAINE LIPOSOME 1.3 % IJ SUSP
INTRAMUSCULAR | Status: DC | PRN
  Administered 2022-09-04 (×2): 10 mL via PERINEURAL

## 2022-09-04 MED ORDER — BUPIVACAINE HCL (PF) 0.25 % IJ SOLN
INTRAMUSCULAR | Status: DC | PRN
  Administered 2022-09-04 (×2): 15 mL via PERINEURAL

## 2022-09-04 MED ORDER — ACETAMINOPHEN 500 MG PO TABS
1000.0000 mg | ORAL_TABLET | Freq: Four times a day (QID) | ORAL | Status: DC
  Administered 2022-09-04 (×3): 1000 mg via ORAL
  Filled 2022-09-04 (×3): qty 2

## 2022-09-04 MED ORDER — OXYCODONE HCL 5 MG PO TABS: 5.0000 mg | ORAL_TABLET | Freq: Four times a day (QID) | ORAL | Status: DC | PRN

## 2022-09-04 MED ORDER — GABAPENTIN 300 MG PO CAPS
300.0000 mg | ORAL_CAPSULE | Freq: Every day | ORAL | Status: DC
  Administered 2022-09-04 – 2022-09-05 (×2): 300 mg via ORAL
  Filled 2022-09-04 (×2): qty 1

## 2022-09-04 MED ORDER — TETANUS-DIPHTH-ACELL PERTUSSIS 5-2.5-18.5 LF-MCG/0.5 IM SUSY
0.5000 mL | PREFILLED_SYRINGE | Freq: Once | INTRAMUSCULAR | Status: DC
  Filled 2022-09-04: qty 0.5

## 2022-09-04 MED ORDER — CLINDAMYCIN PHOSPHATE 900 MG/50ML IV SOLN
900.0000 mg | Freq: Three times a day (TID) | INTRAVENOUS | Status: DC
  Administered 2022-09-05 – 2022-09-06 (×5): 900 mg via INTRAVENOUS
  Filled 2022-09-04 (×5): qty 50

## 2022-09-04 MED ORDER — IBUPROFEN 600 MG PO TABS
600.0000 mg | ORAL_TABLET | Freq: Four times a day (QID) | ORAL | Status: DC
  Administered 2022-09-05 – 2022-09-06 (×5): 600 mg via ORAL
  Filled 2022-09-04 (×7): qty 1

## 2022-09-04 MED ORDER — BUPIVACAINE HCL (PF) 0.25 % IJ SOLN: INTRAMUSCULAR | Status: DC | PRN

## 2022-09-04 MED ORDER — BISACODYL 10 MG RE SUPP: 10.0000 mg | Freq: Every day | RECTAL | Status: DC | PRN

## 2022-09-04 MED ORDER — MEASLES, MUMPS & RUBELLA VAC IJ SOLR
0.5000 mL | Freq: Once | INTRAMUSCULAR | Status: DC
  Filled 2022-09-04: qty 0.5

## 2022-09-04 MED ORDER — NALOXONE HCL 4 MG/10ML IJ SOLN: 1.0000 ug/kg/h | INTRAVENOUS | Status: DC | PRN

## 2022-09-04 MED ORDER — DIPHENHYDRAMINE HCL 25 MG PO CAPS: 25.0000 mg | ORAL_CAPSULE | ORAL | Status: DC | PRN

## 2022-09-04 MED ORDER — TRANEXAMIC ACID-NACL 1000-0.7 MG/100ML-% IV SOLN: 1000.0000 mg | Freq: Once | INTRAVENOUS | Status: DC

## 2022-09-04 MED ORDER — FLEET ENEMA 7-19 GM/118ML RE ENEM: 1.0000 | ENEMA | Freq: Every day | RECTAL | Status: DC | PRN

## 2022-09-04 MED ORDER — MENTHOL 3 MG MT LOZG
1.0000 | LOZENGE | OROMUCOSAL | Status: DC | PRN
  Administered 2022-09-06: 3 mg via ORAL
  Filled 2022-09-04: qty 9

## 2022-09-04 MED ORDER — DIPHENHYDRAMINE HCL 25 MG PO CAPS: 25.0000 mg | ORAL_CAPSULE | Freq: Four times a day (QID) | ORAL | Status: DC | PRN

## 2022-09-04 MED ORDER — FERROUS SULFATE 325 (65 FE) MG PO TABS
325.0000 mg | ORAL_TABLET | Freq: Two times a day (BID) | ORAL | Status: DC
  Administered 2022-09-04 – 2022-09-06 (×5): 325 mg via ORAL
  Filled 2022-09-04 (×5): qty 1

## 2022-09-04 MED ORDER — SODIUM CHLORIDE 0.9% FLUSH: 9.0000 mL | INTRAVENOUS | Status: DC | PRN

## 2022-09-04 MED ORDER — LABETALOL HCL 5 MG/ML IV SOLN: 40.0000 mg | INTRAVENOUS | Status: DC | PRN

## 2022-09-04 MED ORDER — HYDROMORPHONE HCL 1 MG/ML IJ SOLN
1.0000 mg | INTRAMUSCULAR | Status: DC | PRN
  Administered 2022-09-04 – 2022-09-05 (×2): 2 mg via INTRAVENOUS
  Filled 2022-09-04 (×2): qty 2

## 2022-09-04 MED ORDER — PRENATAL MULTIVITAMIN CH
1.0000 | ORAL_TABLET | Freq: Every day | ORAL | Status: DC
  Administered 2022-09-04 – 2022-09-06 (×3): 1 via ORAL
  Filled 2022-09-04 (×3): qty 1

## 2022-09-04 MED ORDER — MAGNESIUM SULFATE 40 GM/1000ML IV SOLN
2.0000 g/h | INTRAVENOUS | Status: DC
  Administered 2022-09-04 (×2): 2 g/h via INTRAVENOUS
  Filled 2022-09-04 (×2): qty 1000

## 2022-09-04 MED ORDER — CLINDAMYCIN PHOSPHATE 900 MG/50ML IV SOLN
900.0000 mg | Freq: Three times a day (TID) | INTRAVENOUS | Status: DC
  Administered 2022-09-04: 900 mg via INTRAVENOUS
  Filled 2022-09-04: qty 50

## 2022-09-04 MED ORDER — SIMETHICONE 80 MG PO CHEW
80.0000 mg | CHEWABLE_TABLET | ORAL | Status: DC | PRN
  Administered 2022-09-05: 80 mg via ORAL
  Filled 2022-09-04: qty 1

## 2022-09-04 MED ORDER — SODIUM CHLORIDE 0.9% IV SOLUTION: Freq: Once | INTRAVENOUS | Status: DC

## 2022-09-04 MED ORDER — LACTATED RINGERS IV SOLN: INTRAVENOUS | Status: DC

## 2022-09-04 MED ORDER — SIMETHICONE 80 MG PO CHEW
80.0000 mg | CHEWABLE_TABLET | Freq: Three times a day (TID) | ORAL | Status: DC
  Administered 2022-09-04 – 2022-09-06 (×6): 80 mg via ORAL
  Filled 2022-09-04 (×6): qty 1

## 2022-09-04 MED ORDER — SCOPOLAMINE 1 MG/3DAYS TD PT72
1.0000 | MEDICATED_PATCH | Freq: Once | TRANSDERMAL | Status: DC
  Administered 2022-09-04: 1.5 mg via TRANSDERMAL
  Filled 2022-09-04: qty 1

## 2022-09-04 MED ORDER — NALOXONE HCL 0.4 MG/ML IJ SOLN: 0.4000 mg | INTRAMUSCULAR | Status: DC | PRN

## 2022-09-04 MED ORDER — KETOROLAC TROMETHAMINE 30 MG/ML IJ SOLN
30.0000 mg | Freq: Four times a day (QID) | INTRAMUSCULAR | Status: AC
  Administered 2022-09-04 – 2022-09-05 (×4): 30 mg via INTRAVENOUS
  Filled 2022-09-04 (×4): qty 1

## 2022-09-04 MED ORDER — ONDANSETRON HCL 4 MG/2ML IJ SOLN: 4.0000 mg | Freq: Three times a day (TID) | INTRAMUSCULAR | Status: DC | PRN

## 2022-09-04 MED ORDER — DIPHENHYDRAMINE HCL 50 MG/ML IJ SOLN: 12.5000 mg | Freq: Four times a day (QID) | INTRAMUSCULAR | Status: DC | PRN

## 2022-09-04 MED ORDER — HYDRALAZINE HCL 20 MG/ML IJ SOLN: 10.0000 mg | INTRAMUSCULAR | Status: DC | PRN

## 2022-09-04 MED ORDER — MAGNESIUM SULFATE BOLUS VIA INFUSION
4.0000 g | Freq: Once | INTRAVENOUS | Status: AC
  Administered 2022-09-04: 4 g via INTRAVENOUS
  Filled 2022-09-04: qty 1000

## 2022-09-04 MED ORDER — SODIUM CHLORIDE 0.9% IV SOLUTION: Freq: Once | INTRAVENOUS | Status: AC

## 2022-09-04 MED ORDER — MEPERIDINE HCL 25 MG/ML IJ SOLN: 6.2500 mg | INTRAMUSCULAR | Status: DC | PRN

## 2022-09-04 MED ORDER — HYDROMORPHONE 1 MG/ML IV SOLN
INTRAVENOUS | Status: DC
  Filled 2022-09-04 (×6): qty 30

## 2022-09-04 MED ORDER — SODIUM CHLORIDE 0.9% FLUSH: 3.0000 mL | INTRAVENOUS | Status: DC | PRN

## 2022-09-04 MED ORDER — BUPIVACAINE LIPOSOME 1.3 % IJ SUSP
INTRAMUSCULAR | Status: AC
  Filled 2022-09-04: qty 20

## 2022-09-04 MED ORDER — LABETALOL HCL 5 MG/ML IV SOLN: 20.0000 mg | INTRAVENOUS | Status: DC | PRN

## 2022-09-04 MED ORDER — OXYCODONE HCL 5 MG PO TABS
5.0000 mg | ORAL_TABLET | ORAL | Status: DC | PRN
  Administered 2022-09-05 (×2): 5 mg via ORAL
  Administered 2022-09-06: 10 mg via ORAL
  Filled 2022-09-04: qty 1
  Filled 2022-09-04 (×2): qty 2

## 2022-09-04 MED ORDER — DIPHENHYDRAMINE HCL 12.5 MG/5ML PO ELIX: 12.5000 mg | ORAL_SOLUTION | Freq: Four times a day (QID) | ORAL | Status: DC | PRN

## 2022-09-04 MED ORDER — ACETAMINOPHEN 500 MG PO TABS: 1000.0000 mg | ORAL_TABLET | Freq: Four times a day (QID) | ORAL | Status: DC

## 2022-09-04 MED ORDER — LABETALOL HCL 5 MG/ML IV SOLN: 80.0000 mg | INTRAVENOUS | Status: DC | PRN

## 2022-09-04 MED ORDER — SODIUM CHLORIDE 0.9% FLUSH
INTRAVENOUS | Status: DC | PRN
  Administered 2022-09-04 (×2): 15 mL

## 2022-09-04 MED ORDER — ONDANSETRON HCL 4 MG/2ML IJ SOLN: 4.0000 mg | Freq: Four times a day (QID) | INTRAMUSCULAR | Status: DC | PRN

## 2022-09-04 MED ORDER — DIPHENHYDRAMINE HCL 50 MG/ML IJ SOLN: 12.5000 mg | INTRAMUSCULAR | Status: DC | PRN

## 2022-09-04 MED ORDER — OXYTOCIN-SODIUM CHLORIDE 30-0.9 UT/500ML-% IV SOLN
2.5000 [IU]/h | INTRAVENOUS | Status: AC
  Administered 2022-09-04: 2.5 [IU]/h via INTRAVENOUS
  Filled 2022-09-04: qty 500

## 2022-09-04 MED ORDER — COCONUT OIL OIL: 1.0000 | TOPICAL_OIL | Status: DC | PRN

## 2022-09-04 MED ORDER — DIBUCAINE (PERIANAL) 1 % EX OINT: 1.0000 | TOPICAL_OINTMENT | CUTANEOUS | Status: DC | PRN

## 2022-09-04 MED ORDER — SENNOSIDES-DOCUSATE SODIUM 8.6-50 MG PO TABS
2.0000 | ORAL_TABLET | ORAL | Status: DC
  Administered 2022-09-04: 2 via ORAL
  Filled 2022-09-04 (×2): qty 2

## 2022-09-04 MED ORDER — WITCH HAZEL-GLYCERIN EX PADS: 1.0000 | MEDICATED_PAD | CUTANEOUS | Status: DC | PRN

## 2022-09-04 NOTE — Progress Notes (Signed)
Added clinda for full coverage chorio s/p c/s as WBC remains elevated.

## 2022-09-04 NOTE — Progress Notes (Signed)
Discussed all the results from my previous notes and plan with patient. She is in agreement and has no questions at this time.

## 2022-09-04 NOTE — Progress Notes (Signed)
Dilaudid PCA pump stopped at 2155 for patient transport to OR. 24mL remain in syringe. Wasted in pyxis with K. Allred RN. Total of 6mL received by patient.

## 2022-09-04 NOTE — Anesthesia Procedure Notes (Signed)
Anesthesia Regional Block: TAP block (bilateral)   Pre-Anesthetic Checklist: , timeout performed,  Correct Patient, Correct Site, Correct Laterality,  Correct Procedure, Correct Position, site marked,  Risks and benefits discussed,  Surgical consent,  Pre-op evaluation,  At surgeon's request and post-op pain management  Laterality: Right and Left  Prep: chloraprep       Needles:  Injection technique: Single-shot  Needle Type: Stimiplex     Needle Length: 9cm  Needle Gauge: 22     Additional Needles:   Procedures:,,,, ultrasound used (permanent image in chart),,    Narrative:  Start time: 09/04/2022 1:34 AM End time: 09/04/2022 1:40 AM Injection made incrementally with aspirations every 5 mL.  Performed by: Personally  Anesthesiologist: Foye Deer, MD  Additional Notes: Patient consented for risk and benefits of nerve block including but not limited to nerve damage, failed block, bleeding and infection.  Patient voiced understanding.  Functioning IV was confirmed and monitors were applied.  Timeout done prior to procedure and prior to any sedation being given to the patient.  Patient confirmed procedure site prior to any sedation given to the patient. Sterile prep,hand hygiene and sterile gloves were used.  Minimal sedation used for procedure.  No paresthesia endorsed by patient during the procedure.  Negative aspiration and negative test dose prior to incremental administration of local anesthetic. The patient tolerated the procedure well with no immediate complications.

## 2022-09-04 NOTE — Progress Notes (Signed)
Dr. Dalbert Garnet, Chari Manning CNM, and Donato Schultz CNM remain on unit an in communication with RN and Dr. Laural Benes. After successful placement of central line, Dr. Dalbert Garnet and both midwives at bedside for SVE. Ongoing discussion with patient, RN and providers regarding possible need for cesarean delivery due to patient's condition.

## 2022-09-04 NOTE — Transfer of Care (Signed)
Immediate Anesthesia Transfer of Care Note  Patient: Chasitty Saligan Medel  Procedure(s) Performed: CESAREAN SECTION  Patient Location: PACU and Mother/Baby  Anesthesia Type:General  Level of Consciousness: drowsy and patient cooperative  Airway & Oxygen Therapy: Patient Spontanous Breathing and Patient connected to face mask oxygen  Post-op Assessment: Report given to RN and Post -op Vital signs reviewed and stable  Post vital signs: Reviewed and stable  Last Vitals:  Vitals Value Taken Time  BP 107/87   Temp    Pulse 97 09/04/22 0014  Resp 17 09/04/22 0013  SpO2 79 % 09/04/22 0014  Vitals shown include unfiled device data.  Last Pain:  Vitals:   09/03/22 2059  TempSrc: Axillary  PainSc:          Complications: No notable events documented.

## 2022-09-04 NOTE — Progress Notes (Signed)
Central Line placed at 2049.

## 2022-09-04 NOTE — Progress Notes (Signed)
Postop Day  1  Subjective: 27 y.o. G1P0100 postpartum day #1 status post primary cesarean section. She is not ambulating, is tolerating po, is not voiding spontaneously, foley remains intact  Her pain is well controlled on PO pain medications. Her lochia is less than menses.  Objective: BP 117/65   Pulse 93   Temp 98.6 F (37 C) (Axillary)   Resp 16   Wt 91 kg   LMP 01/01/2022 (Exact Date)   SpO2 98%   Breastfeeding Unknown   BMI 37.91 kg/m    Physical Exam:  General: alert, cooperative, and appears stated age Breasts: soft/nontender Pulm: nl effort Abdomen: soft, non-tender, active bowel sounds Uterine Fundus: firm Incision: healing well, no significant drainage, no dehiscence, no significant erythema, wound vac intact and patent Perineum: minimal edema, intact Lochia: appropriate DVT Evaluation: No evidence of DVT seen on physical exam. Negative Homan's sign. No cords or calf tenderness. No significant calf/ankle edema.  Recent Labs    09/04/22 0140 09/04/22 0608  HGB 7.9* 7.4*  HCT 23.6* 20.9*  WBC 14.6* 22.8*  PLT 57* 92*    Assessment/Plan: 27 y.o. G1P0100 postpartum day # 1  1. Continue routine postpartum care  2.Continue pp Magnesium Sulfate 50 g/hr x 24 hr post delivery  3. Monitor I'O's, VS  4. Maintain Foley  5. Continue IV antibiotics Ampicillin, Azithromycin, Gentamicin   5. Acute blood loss anemia - clinically significant.  --Hemodynamically stable and asymptomatic --Intervention: start on oral supplementation with ferrous sulfate 325 mg  and continue to monitor H&H  6. Immunization status:   all immunizations up to date    Disposition: continue inpatient postpartum care    LOS: 1 day   Anitria Andon Wonda Amis, CNM 09/04/2022, 8:43 AM   ----- Chari Manning Certified Nurse Midwife [facility]

## 2022-09-04 NOTE — Progress Notes (Addendum)
Patient with severely elevated BP prior to delivery in the OR, and with intubation patient's BP normalized.   After delivery, pressures remained elevated and iv labetalol started. If this remains true 1 hr after delivery and her fluid shifts, we will start magnesium.  Plan to repeat labs in 1 hr. Her AST and P:C were elevated prior to delivery.

## 2022-09-04 NOTE — Anesthesia Postprocedure Evaluation (Signed)
Anesthesia Post Note  Patient: Savannah Hoffman  Procedure(s) Performed: CESAREAN SECTION  Patient location during evaluation: Mother Baby Anesthesia Type: General Level of consciousness: oriented and awake and alert Pain management: pain level controlled Vital Signs Assessment: post-procedure vital signs reviewed and stable Respiratory status: spontaneous breathing and respiratory function stable Cardiovascular status: blood pressure returned to baseline and stable Postop Assessment: no headache, no backache, no apparent nausea or vomiting and able to ambulate Anesthetic complications: no  No notable events documented.   Last Vitals:  Vitals:   09/04/22 0625 09/04/22 0630  BP:    Pulse:    Resp:    Temp:    SpO2: 99% 97%    Last Pain:  Vitals:   09/04/22 0600  TempSrc:   PainSc: 6                  Starling Manns

## 2022-09-04 NOTE — Progress Notes (Signed)
Dr. Laural Benes at bedside for central line placement. Patient consented. Mother of patient at bedside for consent. Interpreter used to explain procedure to mother of patient and obtain consent.

## 2022-09-04 NOTE — Progress Notes (Signed)
RN remains at bedside, Dr. Laural Benes remains at bedside for central line placement. Difficulty assessing contractions on toco. Unable to palpate abdomen due to sterile procedure.

## 2022-09-04 NOTE — Progress Notes (Signed)
RN called lab to request ETA regarding pending labs - parvo, cardiolipin, and lupus anticoag.

## 2022-09-04 NOTE — Progress Notes (Signed)
Pt seen and examined. Bleeding minimal. Diuresing well, in last 2 hours.  Note not completed at the time because I was in another bleeding case, but pts bp remained elevated and we determined to start magnesium for Severe PreE in the setting of proteinuria and elevated LFTs, with severe pressures.   Her plts returned low int he 50s, and 2 more packs given with appropriate rise to the 90s. Have two on hold in the bank if needed. Will repeat in 4 hrs.   Blood draws from central line.  Will continue to monitor in house

## 2022-09-04 NOTE — Op Note (Addendum)
Cesarean Section Procedure Note  Date of procedure: 09/04/2022   Pre-operative Diagnosis: Intrauterine pregnancy at [redacted]w[redacted]d;  - intrauterine fetal demise - DIC  - remote from delivery  Post-operative Diagnosis: same, delivered.  Procedure: Primary Low Transverse Cesarean Section through Pfannenstiel incision  Surgeon: Christeen Douglas, MD  Assistant(s):  Donato Schultz, CNM; Chari Manning, CNM  An experienced assistant was required given the standard of surgical care given the complexity of the case.  This assistant was needed for exposure, dissection, suctioning, retraction, instrument exchange,  CNM assisting with delivery with administration of fundal pressure, and for overall help during the procedure.   NP Georgiann Hahn was present at delivery as well  Anesthesia: General endotracheal anesthesia  Anesthesiologist: No responsible provider has been recorded for the case. Anesthesiologist: Foye Deer, MD CRNA: Elmarie Mainland, CRNA  Estimated Blood Loss:           Drains: Foley         Total IV Fluids: of LR: please see anesthesia's note for total blood products given  Urine Output:         Specimens: placental segment for fetal karyotype; placenta to pathology. Neonate to pathology         Complications:  Severely elevated BP and tachycardia until intubation. Placental abruption with approx 1L of clots evacuated         Disposition: PACU - hemodynamically stable. Extubated in the OR         Condition: guarded  Findings:  A female infant in cephalic presentation. Amniotic fluid - None  Birth weight 4#11oz  APGAR (1 MIN):  0 APGAR (5 MINS):  0 APGAR (10 MINS):     Abrupted placenta, Couvelaire uterus. Large clots delivered with fetus. No adhesions. Small oozing throughout. Minimal uterine output until after delivery.  Indications:   Procedure Details  The patient was taken to Operating Room, identified as the correct patient and the  procedure verified as C-Section Delivery. A formal Time Out was held with all team members present and in agreement.  Before induction of general anesthesia, the patient was draped and prepped in the usual sterile manner. A Pfannenstiel skin incision was made and carried down through the subcutaneous tissue to the fascia. Fascial incision was made and extended transversely with the Mayo scissors. The fascia was separated from the underlying rectus tissue superiorly and inferiorly. The peritoneum was identified and entered bluntly. Peritoneal incision was extended longitudinally.  A low transverse hysterotomy was made. The fetus was delivered atraumatically. The umbilical cord was clamped x2 and cut and the infant was handed to the awaiting nursery staff. The placenta delivered itself in pieces, with large clots evacuated.   The uterus was exteriorized and cleared of all clot and debris. The hysterotomy was closed with running sutures of 0-Vicryl. A second imbricating layer was placed with the same suture. Continued oozing . The peritoneal cavity was cleared of all clots and debris, and was irrigated. The uterus was returned to the abdomen.   Gutters and pelvis were evaluated and moderate hemostasis was noted. Surgicel was called from the main OR and pressure held over the incision.   The fascia was then reapproximated with running sutures of 0 Maxon.  The subcutaneous tissue was reapproximated with running sutures of 0 Vicryl. The skin was reapproximated with Ensorb absorbable staples. 60 of 0.25% bupivicaine was placed in the fascial and skin lines. Provena was placed to assist with oozing which continued. However, her coagulopathy appeared reversed.  Instrument, sponge, and needle counts were correct prior to the abdominal closure and at the conclusion of the case.   The patient was extubated effectively at the close of the case. TAP block placed by anesthesia.  She was given IM hemabate during the  case, and TXA, and pitocin.  The patient tolerated the procedure well and was transferred to the recovery room in stable condition.   Christeen Douglas, MD 09/04/2022

## 2022-09-04 NOTE — Progress Notes (Signed)
Hgb 6.1 and has continued to drop, albeit slowly. No indicators that she has continued to bleed with vitals otherwise normal (Pulse is 100 at most).   Will transfuse another unit of pRBC.    Diuresing well.  Not on an antihypertensive with normal blood pressures. No evidence of magnesium toxicity. WBC count now at 19.7 after adding clindamycin today and afebrile. Platelets stable at 92 (from 94) AST holding steady at 56 (from 54).  Will hold Tylenol to see if affecting and reassess based on 6AM labs.   Overall, pleased with current status.  Expect continued improvement. Will remain cautious for changes.  Thomasene Mohair, MD, [facility] 09/04/2022 11:09 PM

## 2022-09-04 NOTE — Progress Notes (Signed)
Multiple blood products hung, after massive transfusion protocol initiated, using the [facility] rapid transfuser. Blood products verified with 2 RN's, but not scanned into epic due to emergent nature. Units [id number] and [id number] verified with K. Hattye Siegfried RN and K. Allred RN initiated at 2117 on 09/03/22 and completed at 2140. Units [id number] and [id number] started at 2142 and 2144 ended at 2158 and 2200, verified by K. Lavarius Doughten RN and K. Allred RN. Additional unit hung by CRNA en route to OR, verified by K. Allred RN [id number]. Additional blood products administered per Anesthesia in OR.

## 2022-09-05 ENCOUNTER — Inpatient Hospital Stay: Payer: MEDICAID

## 2022-09-05 ENCOUNTER — Other Ambulatory Visit: Payer: Self-pay

## 2022-09-05 LAB — CBC WITH DIFFERENTIAL/PLATELET
Abs Immature Granulocytes: 0.28 10*3/uL — ABNORMAL HIGH (ref 0.00–0.07)
Abs Immature Granulocytes: 0.43 10*3/uL — ABNORMAL HIGH (ref 0.00–0.07)
Basophils Absolute: 0 10*3/uL (ref 0.0–0.1)
Basophils Absolute: 0 10*3/uL (ref 0.0–0.1)
Basophils Relative: 0 %
Basophils Relative: 0 %
Eosinophils Absolute: 0.1 10*3/uL (ref 0.0–0.5)
Eosinophils Absolute: 0.2 10*3/uL (ref 0.0–0.5)
Eosinophils Relative: 1 %
Eosinophils Relative: 1 %
HCT: 21.9 % — ABNORMAL LOW (ref 36.0–46.0)
HCT: 22.2 % — ABNORMAL LOW (ref 36.0–46.0)
Hemoglobin: 7.5 g/dL — ABNORMAL LOW (ref 12.0–15.0)
Hemoglobin: 7.5 g/dL — ABNORMAL LOW (ref 12.0–15.0)
Immature Granulocytes: 1 %
Immature Granulocytes: 2 %
Lymphocytes Relative: 6 %
Lymphocytes Relative: 16 %
Lymphs Abs: 1.2 10*3/uL (ref 0.7–4.0)
Lymphs Abs: 3.2 10*3/uL (ref 0.7–4.0)
MCH: 29.2 pg (ref 26.0–34.0)
MCH: 29.2 pg (ref 26.0–34.0)
MCHC: 34.2 g/dL (ref 30.0–36.0)
MCHC: 33.8 g/dL (ref 30.0–36.0)
MCV: 85.2 fL (ref 80.0–100.0)
MCV: 86.4 fL (ref 80.0–100.0)
Monocytes Absolute: 0.8 10*3/uL (ref 0.1–1.0)
Monocytes Absolute: 0.8 10*3/uL (ref 0.1–1.0)
Monocytes Relative: 4 %
Monocytes Relative: 4 %
Neutro Abs: 16.9 10*3/uL — ABNORMAL HIGH (ref 1.7–7.7)
Neutro Abs: 15.3 10*3/uL — ABNORMAL HIGH (ref 1.7–7.7)
Neutrophils Relative %: 88 %
Neutrophils Relative %: 77 %
Platelets: 89 10*3/uL — ABNORMAL LOW (ref 150–400)
Platelets: 100 10*3/uL — ABNORMAL LOW (ref 150–400)
RBC: 2.57 MIL/uL — ABNORMAL LOW (ref 3.87–5.11)
RBC: 2.57 MIL/uL — ABNORMAL LOW (ref 3.87–5.11)
RDW: 15.3 % (ref 11.5–15.5)
RDW: 15.6 % — ABNORMAL HIGH (ref 11.5–15.5)
WBC: 19.4 10*3/uL — ABNORMAL HIGH (ref 4.0–10.5)
WBC: 19.9 10*3/uL — ABNORMAL HIGH (ref 4.0–10.5)
nRBC: 0.3 % — ABNORMAL HIGH (ref 0.0–0.2)
nRBC: 0.3 % — ABNORMAL HIGH (ref 0.0–0.2)

## 2022-09-05 LAB — COMPREHENSIVE METABOLIC PANEL
ALT: 20 U/L (ref 0–44)
ALT: 27 U/L (ref 0–44)
AST: 61 U/L — ABNORMAL HIGH (ref 15–41)
AST: 77 U/L — ABNORMAL HIGH (ref 15–41)
Albumin: 2.2 g/dL — ABNORMAL LOW (ref 3.5–5.0)
Albumin: 2.3 g/dL — ABNORMAL LOW (ref 3.5–5.0)
Alkaline Phosphatase: 130 U/L — ABNORMAL HIGH (ref 38–126)
Alkaline Phosphatase: 131 U/L — ABNORMAL HIGH (ref 38–126)
Anion gap: 6 (ref 5–15)
Anion gap: 7 (ref 5–15)
BUN: 8 mg/dL (ref 6–20)
BUN: 8 mg/dL (ref 6–20)
CO2: 25 mmol/L (ref 22–32)
CO2: 23 mmol/L (ref 22–32)
Calcium: 7 mg/dL — ABNORMAL LOW (ref 8.9–10.3)
Calcium: 7.4 mg/dL — ABNORMAL LOW (ref 8.9–10.3)
Chloride: 104 mmol/L (ref 98–111)
Chloride: 105 mmol/L (ref 98–111)
Creatinine, Ser: 0.53 mg/dL (ref 0.44–1.00)
Creatinine, Ser: 0.6 mg/dL (ref 0.44–1.00)
GFR, Estimated: >60 mL/min (ref >60)
GFR, Estimated: >60 mL/min (ref >60)
Glucose, Bld: 90 mg/dL (ref 70–99)
Glucose, Bld: 104 mg/dL — ABNORMAL HIGH (ref 70–99)
Potassium: 3.7 mmol/L (ref 3.5–5.1)
Potassium: 3.6 mmol/L (ref 3.5–5.1)
Sodium: 135 mmol/L (ref 135–145)
Sodium: 135 mmol/L (ref 135–145)
Total Bilirubin: 0.5 mg/dL (ref 0.3–1.2)
Total Bilirubin: 0.5 mg/dL (ref 0.3–1.2)
Total Protein: 5 g/dL — ABNORMAL LOW (ref 6.5–8.1)
Total Protein: 5.3 g/dL — ABNORMAL LOW (ref 6.5–8.1)

## 2022-09-05 LAB — LACTIC ACID, PLASMA: Lactic Acid, Venous: 1.1 mmol/L (ref 0.5–1.9)

## 2022-09-05 MED ORDER — SODIUM CHLORIDE 0.9 % IV SOLN
2.0000 g | Freq: Four times a day (QID) | INTRAVENOUS | Status: DC
  Administered 2022-09-05 – 2022-09-06 (×4): 2 g via INTRAVENOUS
  Filled 2022-09-05 (×4): qty 2000

## 2022-09-05 MED ORDER — LOPERAMIDE HCL 2 MG PO CAPS
2.0000 mg | ORAL_CAPSULE | ORAL | Status: DC | PRN
  Administered 2022-09-05: 2 mg via ORAL
  Filled 2022-09-05 (×2): qty 1

## 2022-09-05 NOTE — Progress Notes (Signed)
S: Feeling well, improved overall. Coping well and appropriately with the loss of her infant.  O: Vitals:   09/05/22 0738 09/05/22 1108  BP: 127/73 134/89  Pulse: 93 (!) 105  Resp: 18 16  Temp: 98.7 F (37.1 C) 98.5 F (36.9 C)  SpO2:     PE:  CV: RRR Pulm: Decreased breath sounds in the right lower lobe, normal breath sounds in 5 other lung fields. Abd: Appropriately tender to palpation, no peritoneal signs.  Wound is clean and dry and intact. Lochia: Within normal limits  A/P: Patient appears well.  She is exhausted, but not particularly pale, and has no localized symptoms or signs of infection.  Her bleeding is within normal limits.  Patient remains afebrile. her blood pressure has been within normal limits after her magnesium finished her 24-hour course yesterday. She is satting 94 to 100% on room air.  Elevated WBC: White blood cell count has elevated again.  We have been treating her empirically with broad-spectrum antibiotics for presumed CORHIO, and is now on amp gent and clinda.  However, her exam is reassuring for no endometritis.  Will get chest x-ray, blood cultures, urine culture at this time.  Follow gent levels.  Continue monitoring white blood cell count and temperature.  Will try to localize a source, with low threshold for intensive engagement if needed.  If no source found, will continue broad-spectrum antibiotics until resolution.  Severe acute anemia: Status post transfusion overnight.  Her latest hemoglobin is 7.5, and I am do not have a source for internal bleeding.  Will continue to monitor, but I think it is likely equilibration from her DIC.  Will continue to transfuse if needed to keep her hemoglobin above 7.  DIC: Platelets remaining stable currently last at 89.  Will transfuse to keep above 50 if needed, but anticipate continued stability.  Fibrinogen has been above 200 consistently.  Severe preeclampsia/HELLP syndrome: Continue to follow elevated AST.  It  is stable at 61, and will check hepatitis panel at this time.  Hold hepatotoxic medications.  PC ratio returned to normal after delivery.

## 2022-09-05 NOTE — Progress Notes (Signed)
Verbal information given to this RN by Wendall Papa about afternoon fundal check.

## 2022-09-05 NOTE — Progress Notes (Signed)
S: Pt is doing well, denies significant pain, able to void after foley removed, ambulating well.   O:  Hgb: stable Plt: increased to 89 AST increased to 61  A/P:   1) Pre-eclampsia - BPs since turning off Mag - 120/63, 127/73, 134/89, 140/78 - I&O last shift - at 14mL/hr - Continue to monitor Liver enzymes   2) Possible infection - Afebrile  - Monitoring WBCs  - Monitoring Lactic acid  - Ampicillin Q6 hrs  - Clindamycin Q 8hrs - Gentamicin Q24 hrs  Savannah Hoffman 09/05/2022 5:18 PM

## 2022-09-05 NOTE — Progress Notes (Signed)
POSTPARTUM PROGRESS NOTE  Savannah Hoffman is a 27 y.o. G1P0100 at [redacted]w[redacted]d who is admitted for abdominal pain with subsequent IUFD and DIC.    Estimated Date of Delivery: 10/08/22  Length of Stay:  2 Days. Admitted 09/03/2022  Subjective: Pt is feeling better, decreasing pain, minimal bleeding.  She is experiencing diarrhea, likely d/t the Hemabate given to her.  Vitals:  BP 127/73 (BP Location: Right Arm)   Pulse 93   Temp 98.7 F (37.1 C) (Oral)   Resp 18   Wt 91 kg   LMP 01/01/2022 (Exact Date)   SpO2 95%   Breastfeeding Unknown   BMI 37.91 kg/m   Today's Vitals   09/05/22 0245 09/05/22 0330 09/05/22 0420 09/05/22 0738  BP: 124/69  120/63 127/73  Pulse: (!) 104  100 93  Resp: 19 19 18 18   Temp: 98.9 F (37.2 C)  98.9 F (37.2 C) 98.7 F (37.1 C)  TempSrc: Oral  Oral Oral  SpO2: 95%     Weight:      PainSc:    0-No pain    Physical Examination: General: alert, cooperative Breasts: soft/nontender Pulm: nl effort Abdomen: soft, non-tender Uterine Fundus: firm Incision: healing well, no significant drainage, no dehiscence, no significant erythema, wound vac intact and patent Perineum: minimal edema, intact Lochia: appropriate DVT Evaluation: No evidence of DVT seen on physical exam. Negative Homan's sign.  Results for orders placed or performed during the hospital encounter of 09/03/22 (from the past 48 hour(s))  ABO/Rh     Status: None   Collection Time: 09/03/22 10:09 AM  Result Value Ref Range   ABO/RH(D)      O POS Performed at [facility], [address] [place], [place] [address]   Kleihauer-Betke stain     Status: None   Collection Time: 09/03/22 10:09 AM  Result Value Ref Range   Fetal Cells % 0 %   Quantitation Fetal Hemoglobin 0.0000 mL    Comment: COVERS UP TO 15 MLS   # Vials RhIg NOT INDICATED     Comment: Performed at [facility], [address] [place], [place] [address]  Parvovirus B19 antibody, IgG and IgM      Status: None   Collection Time: 09/03/22 10:09 AM  Result Value Ref Range   Parovirus B19 IgG Abs 0.3 0.0 - 0.8 index    Comment: (NOTE)                                 Negative        <0.9                                 Equivocal  0.9 - 1.1                                 Positive        >1.1    Parovirus B19 IgM Abs 0.1 0.0 - 0.8 index    Comment: (NOTE)                                 Negative        <0.9  Equivocal  0.9 - 1.1                                 Positive        >1.1 Performed At: [facility] [address] [place], [place] [id number] Jolene Schimke MD NW:[id number]   RPR     Status: None   Collection Time: 09/03/22 10:09 AM  Result Value Ref Range   RPR Ser Ql NON REACTIVE NON REACTIVE    Comment: Performed at [facility], [address] [place], [place] [address]  TSH     Status: None   Collection Time: 09/03/22 10:09 AM  Result Value Ref Range   TSH 2.366 0.350 - 4.500 uIU/mL    Comment: Performed by a 3rd Generation assay with a functional sensitivity of <=0.01 uIU/mL. Performed at [facility], [address] [place], [place] [address]   Lupus anticoagulant     Status: None   Collection Time: 09/03/22 10:09 AM  Result Value Ref Range   dPT 30.9 0.0 - 47.6 sec   dPT Confirm Ratio 0.99 0.00 - 1.34 Ratio   Thrombin Time 21.3 0.0 - 23.0 sec   PTT Lupus Anticoagulant 34.5 0.0 - 43.5 sec   DRVVT 30.3 0.0 - 47.0 sec   Lupus Anticoag Interp Comment:     Comment: (NOTE) No lupus anticoagulant was detected. Performed At: [facility] [address] [place], [place] [id number] Jolene Schimke MD NU:[id number]   Cardiolipin antibodies, IgM+IgG     Status: None   Collection Time: 09/03/22 10:09 AM  Result Value Ref Range   Anticardiolipin IgG <9 0 - 14 GPL U/mL    Comment: (NOTE)                          Negative:              <15                          Indeterminate:     15 - 20                           Low-Med Positive: >20 - 80                          High Positive:         >80    Anticardiolipin IgM <9 0 - 12 MPL U/mL    Comment: (NOTE)                          Negative:              <13                          Indeterminate:     13 - 20                          Low-Med Positive: >20 - 80                          High Positive:         >80 Performed At: [facility]  [facility] [address] [place], [place] [id number] Jolene Schimke MD NW:[id number]   Hemoglobin A1c     Status: None   Collection Time: 09/03/22 10:09 AM  Result Value Ref Range   Hgb A1c MFr Bld 5.5 4.8 - 5.6 %    Comment: (NOTE) Pre diabetes:          5.7%-6.4%  Diabetes:              >6.4%  Glycemic control for   <7.0% adults with diabetes    Mean Plasma Glucose 111.15 mg/dL    Comment: Performed at [facility], [address], [place], [place] [address]  Comprehensive metabolic panel     Status: Abnormal   Collection Time: 09/03/22 10:09 AM  Result Value Ref Range   Sodium 132 (L) 135 - 145 mmol/L   Potassium 3.6 3.5 - 5.1 mmol/L   Chloride 109 98 - 111 mmol/L   CO2 18 (L) 22 - 32 mmol/L   Glucose, Bld 84 70 - 99 mg/dL    Comment: Glucose reference range applies only to samples taken after fasting for at least 8 hours.   BUN 12 6 - 20 mg/dL   Creatinine, Ser 9.62 0.44 - 1.00 mg/dL   Calcium 7.9 (L) 8.9 - 10.3 mg/dL   Total Protein 6.0 (L) 6.5 - 8.1 g/dL   Albumin 2.8 (L) 3.5 - 5.0 g/dL   AST 39 15 - 41 U/L   ALT 16 0 - 44 U/L   Alkaline Phosphatase 147 (H) 38 - 126 U/L   Total Bilirubin 0.3 0.3 - 1.2 mg/dL   GFR, Estimated >95 >28 mL/min    Comment: (NOTE) Calculated using the CKD-EPI Creatinine Equation (2021)    Anion gap 5 5 - 15    Comment: Performed at [facility], [address] [place], [place] [address]  HIV Antibody (routine testing w rflx)     Status: None   Collection Time: 09/03/22 10:09 AM  Result Value Ref Range   HIV Screen 4th Generation wRfx  Non Reactive Non Reactive    Comment: Performed at [facility], [address], [place], [place] [address]  CBC     Status: Abnormal   Collection Time: 09/03/22 11:49 AM  Result Value Ref Range   WBC 20.7 (H) 4.0 - 10.5 K/uL   RBC 2.77 (L) 3.87 - 5.11 MIL/uL   Hemoglobin 7.5 (L) 12.0 - 15.0 g/dL   HCT 72.5 (L) 36.6 - 44.0 %   MCV 83.8 80.0 - 100.0 fL   MCH 27.1 26.0 - 34.0 pg   MCHC 32.3 30.0 - 36.0 g/dL   RDW 34.7 42.5 - 95.6 %   Platelets 150 150 - 400 K/uL   nRBC 0.0 0.0 - 0.2 %    Comment: Performed at [facility], [address] [place], [place] [address]  Protime-INR     Status: Abnormal   Collection Time: 09/03/22 12:33 PM  Result Value Ref Range   Prothrombin Time 16.5 (H) 11.4 - 15.2 seconds   INR 1.3 (H) 0.8 - 1.2    Comment: (NOTE) INR goal varies based on device and disease states. Performed at [facility], [address] [place], [place] [address]   Fibrinogen     Status: Abnormal   Collection Time: 09/03/22 12:33 PM  Result Value Ref Range   Fibrinogen 142 (L) 210 - 475 mg/dL    Comment: (NOTE) Fibrinogen results may be underestimated in  patients receiving thrombolytic therapy. Performed at [facility], [address] [place], [place] [address]   CBC     Status: Abnormal   Collection Time: 09/03/22 12:33 PM  Result Value Ref Range   WBC 22.6 (H) 4.0 - 10.5 K/uL   RBC 2.73 (L) 3.87 - 5.11 MIL/uL   Hemoglobin 7.5 (L) 12.0 - 15.0 g/dL   HCT 60.4 (L) 54.0 - 98.1 %   MCV 82.1 80.0 - 100.0 fL   MCH 27.5 26.0 - 34.0 pg   MCHC 33.5 30.0 - 36.0 g/dL   RDW 19.1 47.8 - 29.5 %   Platelets 141 (L) 150 - 400 K/uL   nRBC 0.0 0.0 - 0.2 %    Comment: Performed at [facility], [address], [place], [place] [address]  Prepare RBC (crossmatch)     Status: None   Collection Time: 09/03/22 12:51 PM  Result Value Ref Range   Order Confirmation      ORDER PROCESSED BY BLOOD BANK Performed at [facility],  [address] [place], [place] [address]   Prepare RBC     Status: None   Collection Time: 09/03/22 12:56 PM  Result Value Ref Range   Order Confirmation      DUPLICATE REQUEST Performed at [facility], [address] [place], [place] [address]   Prepare platelet pheresis     Status: None   Collection Time: 09/03/22 12:56 PM  Result Value Ref Range   Unit Number [id number]    Blood Component Type PLTPH LR3 7D    Unit division 00    Status of Unit ISSUED,FINAL    Transfusion Status      OK TO TRANSFUSE Performed at [facility], [address], [place], [place] [address]    Unit Number [id number]    Blood Component Type PLTP1 PSORALEN TREATED    Unit division 00    Status of Unit ISSUED,FINAL    Transfusion Status OK TO TRANSFUSE   Prepare cryoprecipitate     Status: None   Collection Time: 09/03/22 12:57 PM  Result Value Ref Range   Unit Number [id number]    Blood Component Type CRYO,THAWED    Unit division 00    Status of Unit ISSUED,FINAL    Transfusion Status OK TO TRANSFUSE    Unit Number [id number]    Blood Component Type CRYO,THAWED    Unit division 00    Status of Unit ISSUED,FINAL    Transfusion Status      OK TO TRANSFUSE Performed at [facility], [address] [place], [place] [address]   Prepare fresh frozen plasma     Status: None   Collection Time: 09/03/22  3:55 PM  Result Value Ref Range   Unit Number [id number]    Blood Component Type THW PLS APHR    Unit division A0    Status of Unit REL FROM [facility]    Transfusion Status OK TO TRANSFUSE    Unit Number [id number]    Blood Component Type THW PLS APHR    Unit division 00    Status of Unit ISSUED,FINAL    Transfusion Status      OK TO TRANSFUSE Performed at [facility], [address] [place], [place] [address]    Unit Number [id number]    Blood Component Type THW PLS APHR    Unit division 00    Status of Unit ISSUED,FINAL     Transfusion Status OK TO  TRANSFUSE    Unit Number [id number]    Blood Component Type THAWED PLASMA    Unit division 00    Status of Unit REL FROM [facility]    Transfusion Status OK TO TRANSFUSE    Unit Number [id number]    Blood Component Type THW PLS APHR    Unit division B0    Status of Unit REL FROM [facility]    Transfusion Status OK TO TRANSFUSE    Unit Number [id number]    Blood Component Type THW PLS APHR    Unit division B0    Status of Unit REL FROM [facility]    Transfusion Status OK TO TRANSFUSE    Unit Number [id number]    Blood Component Type THAWED PLASMA    Unit division 00    Status of Unit REL FROM [facility]    Transfusion Status OK TO TRANSFUSE    Unit Number [id number]    Blood Component Type THAWED PLASMA    Unit division 00    Status of Unit REL FROM [facility]    Transfusion Status OK TO TRANSFUSE   CBC     Status: Abnormal   Collection Time: 09/03/22  5:09 PM  Result Value Ref Range   WBC 26.8 (H) 4.0 - 10.5 K/uL    Comment: REPEATED TO VERIFY WHITE COUNT CONFIRMED ON SMEAR    RBC 2.52 (L) 3.87 - 5.11 MIL/uL   Hemoglobin 6.9 (L) 12.0 - 15.0 g/dL   HCT 64.3 (L) 32.9 - 51.8 %   MCV 81.0 80.0 - 100.0 fL   MCH 27.4 26.0 - 34.0 pg   MCHC 33.8 30.0 - 36.0 g/dL   RDW 84.1 66.0 - 63.0 %   Platelets 104 (L) 150 - 400 K/uL    Comment: Immature Platelet Fraction may be clinically indicated, consider ordering this additional test [id number] REPEATED TO VERIFY PLATELET COUNT CONFIRMED BY SMEAR    nRBC 0.0 0.0 - 0.2 %    Comment: Performed at [facility], [address] [place], [place] [address]  Urinalysis, Routine w reflex microscopic -Urine, Clean Catch     Status: Abnormal   Collection Time: 09/03/22  7:15 PM  Result Value Ref Range   Color, Urine AMBER (A) YELLOW    Comment: BIOCHEMICALS MAY BE AFFECTED BY COLOR   APPearance CLOUDY (A) CLEAR   Specific Gravity, Urine 1.027 1.005 - 1.030   pH 5.0 5.0 - 8.0   Glucose, UA 50 (A) NEGATIVE  mg/dL   Hgb urine dipstick SMALL (A) NEGATIVE   Bilirubin Urine NEGATIVE NEGATIVE   Ketones, ur NEGATIVE NEGATIVE mg/dL   Protein, ur >=220 (A) NEGATIVE mg/dL   Nitrite NEGATIVE NEGATIVE   Leukocytes,Ua NEGATIVE NEGATIVE   RBC / HPF 21-50 0 - 5 RBC/hpf   WBC, UA 11-20 0 - 5 WBC/hpf   Bacteria, UA FEW (A) NONE SEEN   Squamous Epithelial / HPF 6-10 0 - 5 /HPF   Mucus PRESENT    Hyaline Casts, UA PRESENT     Comment: Performed at [facility], [address] [place], [place] [address]  Protein / creatinine ratio, urine     Status: Abnormal   Collection Time: 09/03/22  7:15 PM  Result Value Ref Range   Creatinine, Urine 445 mg/dL    Comment: RESULT CONFIRMED BY MANUAL DILUTION SKL   Total Protein, Urine 584 mg/dL    Comment: RESULT CONFIRMED BY MANUAL DILUTION SKL NO NORMAL RANGE ESTABLISHED FOR THIS TEST    Protein  Creatinine Ratio 1.31 (H) 0.00 - 0.15 mg/mg[Cre]    Comment: Performed at [facility], [address] [place], [place] [address]  Basic metabolic panel     Status: Abnormal   Collection Time: 09/03/22  8:00 PM  Result Value Ref Range   Sodium 135 135 - 145 mmol/L   Potassium 5.5 (H) 3.5 - 5.1 mmol/L   Chloride 109 98 - 111 mmol/L   CO2 15 (L) 22 - 32 mmol/L   Glucose, Bld 116 (H) 70 - 99 mg/dL    Comment: Glucose reference range applies only to samples taken after fasting for at least 8 hours.   BUN 19 6 - 20 mg/dL   Creatinine, Ser 6.04 (H) 0.44 - 1.00 mg/dL   Calcium 7.6 (L) 8.9 - 10.3 mg/dL   GFR, Estimated 56 (L) >60 mL/min    Comment: (NOTE) Calculated using the CKD-EPI Creatinine Equation (2021)    Anion gap 11 5 - 15    Comment: Performed at [facility], [address] [place], [place] [address]  Lactic acid, plasma     Status: Abnormal   Collection Time: 09/03/22  8:00 PM  Result Value Ref Range   Lactic Acid, Venous 4.8 (HH) 0.5 - 1.9 mmol/L    Comment: CRITICAL RESULT CALLED TO, READ BACK BY AND VERIFIED WITH KATIE  ALLRED @2046  ON 09/03/22 SKL Performed at [facility], [address] [place], [place] [address]   DIC Panel now then every 30 minutes     Status: Abnormal   Collection Time: 09/03/22  8:00 PM  Result Value Ref Range   Prothrombin Time 16.2 (H) 11.4 - 15.2 seconds   INR 1.3 (H) 0.8 - 1.2    Comment: (NOTE) INR goal varies based on device and disease states.    aPTT 31 24 - 36 seconds   Fibrinogen 194 (L) 210 - 475 mg/dL    Comment: (NOTE) Fibrinogen results may be underestimated in patients receiving thrombolytic therapy.    D-Dimer, Quant >20.00 (H) 0.00 - 0.50 ug/mL-FEU    Comment: REPEATED TO VERIFY (NOTE) At the manufacturer cut-off value of 0.5 g/mL FEU, this assay has a negative predictive value of 95-100%.This assay is intended for use in conjunction with a clinical pretest probability (PTP) assessment model to exclude pulmonary embolism (PE) and deep venous thrombosis (DVT) in outpatients suspected of PE or DVT. Results should be correlated with clinical presentation.    Platelets 101 (L) 150 - 400 K/uL    Comment: Immature Platelet Fraction may be clinically indicated, consider ordering this additional test [id number]    Smear Review NO SCHISTOCYTES SEEN     Comment: Performed at [facility], [address] [place], [place] [address]  Hemoglobin and hematocrit, blood (STAT)     Status: Abnormal   Collection Time: 09/03/22  8:00 PM  Result Value Ref Range   Hemoglobin 6.3 (L) 12.0 - 15.0 g/dL   HCT 78.4 (L) 69.6 - 29.5 %    Comment: Performed at [facility], [address] [place], [place] [address]  Initiate MTP (Blood Bank Notification)     Status: None   Collection Time: 09/03/22  8:00 PM  Result Value Ref Range   Initiate Massive Transfusion Protocol      MTP ORDER RECEIVED MTP START 09/03/22 @1929  COOLER 4 TO OR @2034  09/03/22 ASW COOLER 4 RETURNED @2258  09/03/22 ASW COOLER 0 TO OR @2306  09/03/22 ASW COOLER 0 RETURNED @0115  09/04/22 ASW  COOLER  0 TO LDR @0312  09/04/22 ASW MTP ENDED 09/04/22 @0731  DAS PER KATIE ALLRED Performed at [facility], [address] [place], [place] [address]   DIC Panel now then every 30 minutes     Status: Abnormal   Collection Time: 09/03/22 10:20 PM  Result Value Ref Range   Prothrombin Time 15.6 (H) 11.4 - 15.2 seconds   INR 1.2 0.8 - 1.2    Comment: (NOTE) INR goal varies based on device and disease states.    aPTT 31 24 - 36 seconds   Fibrinogen 211 210 - 475 mg/dL    Comment: (NOTE) Fibrinogen results may be underestimated in patients receiving thrombolytic therapy.    D-Dimer, Quant >20.00 (H) 0.00 - 0.50 ug/mL-FEU    Comment: REPEATED TO VERIFY (NOTE) At the manufacturer cut-off value of 0.5 g/mL FEU, this assay has a negative predictive value of 95-100%.This assay is intended for use in conjunction with a clinical pretest probability (PTP) assessment model to exclude pulmonary embolism (PE) and deep venous thrombosis (DVT) in outpatients suspected of PE or DVT. Results should be correlated with clinical presentation.    Platelets 92 (L) 150 - 400 K/uL    Comment: Immature Platelet Fraction may be clinically indicated, consider ordering this additional test [id number]    Smear Review NO SCHISTOCYTES SEEN     Comment: Performed at [facility], [address] [place], [place] [address]  CBC     Status: Abnormal   Collection Time: 09/03/22 10:23 PM  Result Value Ref Range   WBC 23.1 (H) 4.0 - 10.5 K/uL   RBC 2.43 (L) 3.87 - 5.11 MIL/uL   Hemoglobin 6.7 (L) 12.0 - 15.0 g/dL   HCT 08.6 (L) 57.8 - 46.9 %   MCV 84.0 80.0 - 100.0 fL   MCH 27.6 26.0 - 34.0 pg   MCHC 32.8 30.0 - 36.0 g/dL   RDW 62.9 (H) 52.8 - 41.3 %   Platelets 90 (L) 150 - 400 K/uL    Comment: Immature Platelet Fraction may be clinically indicated, consider ordering this additional test [id number]    nRBC 0.1 0.0 - 0.2 %    Comment: Performed at [facility], [address] [place], [place] [address]  Basic metabolic panel     Status: Abnormal   Collection Time: 09/03/22 10:23 PM  Result Value Ref Range   Sodium 132 (L) 135 - 145 mmol/L   Potassium 4.8 3.5 - 5.1 mmol/L   Chloride 111 98 - 111 mmol/L   CO2 17 (L) 22 - 32 mmol/L   Glucose, Bld 118 (H) 70 - 99 mg/dL    Comment: Glucose reference range applies only to samples taken after fasting for at least 8 hours.   BUN 19 6 - 20 mg/dL   Creatinine, Ser 4.40 (H) 0.44 - 1.00 mg/dL   Calcium 7.1 (L) 8.9 - 10.3 mg/dL   GFR, Estimated >34 >74 mL/min    Comment: (NOTE) Calculated using the CKD-EPI Creatinine Equation (2021)    Anion gap 4 (L) 5 - 15    Comment: Performed at [facility], [address] [place], [place] [address]  Procalcitonin     Status: None   Collection Time: 09/03/22 10:23 PM  Result Value Ref Range   Procalcitonin 0.19 ng/mL    Comment:        Interpretation: PCT (Procalcitonin) <= 0.5 ng/mL: Systemic infection (sepsis) is not likely. Local bacterial infection is possible. (NOTE)  Sepsis PCT Algorithm           Lower Respiratory Tract                                      Infection PCT Algorithm    ----------------------------     ----------------------------         PCT < 0.25 ng/mL                PCT < 0.10 ng/mL          Strongly encourage             Strongly discourage   discontinuation of antibiotics    initiation of antibiotics    ----------------------------     -----------------------------       PCT 0.25 - 0.50 ng/mL            PCT 0.10 - 0.25 ng/mL               OR       >80% decrease in PCT            Discourage initiation of                                            antibiotics      Encourage discontinuation           of antibiotics    ----------------------------     -----------------------------         PCT >= 0.50 ng/mL              PCT 0.26 - 0.50 ng/mL               AND        <80% decrease in PCT             Encourage initiation of                                              antibiotics       Encourage continuation           of antibiotics    ----------------------------     -----------------------------        PCT >= 0.50 ng/mL                  PCT > 0.50 ng/mL               AND         increase in PCT                  Strongly encourage                                      initiation of antibiotics    Strongly encourage escalation           of antibiotics                                     -----------------------------  PCT <= 0.25 ng/mL                                                 OR                                        > 80% decrease in PCT                                      Discontinue / Do not initiate                                             antibiotics  Performed at [facility], [address] [place], [place] [address]   Hepatic function panel     Status: Abnormal   Collection Time: 09/03/22 10:23 PM  Result Value Ref Range   Total Protein 6.0 (L) 6.5 - 8.1 g/dL   Albumin 2.9 (L) 3.5 - 5.0 g/dL   AST 55 (H) 15 - 41 U/L   ALT 16 0 - 44 U/L   Alkaline Phosphatase 260 (H) 38 - 126 U/L   Total Bilirubin 0.3 0.3 - 1.2 mg/dL   Bilirubin, Direct <2.4 0.0 - 0.2 mg/dL   Indirect Bilirubin NOT CALCULATED 0.3 - 0.9 mg/dL    Comment: Performed at [facility], [address] [place], [place] [address]  Prepare cryoprecipitate     Status: None   Collection Time: 09/03/22 11:00 PM  Result Value Ref Range   Unit Number [id number]    Blood Component Type CRYO,THAWED    Unit division 00    Status of Unit REL FROM [facility]    Transfusion Status OK TO TRANSFUSE    Unit Number [id number]    Blood Component Type CRYO,THAWED    Unit division 00    Status of Unit ISSUED,FINAL    Transfusion Status      OK TO TRANSFUSE Performed at [facility], [address] [place], [place] [address]   CBC     Status: Abnormal   Collection  Time: 09/04/22  1:40 AM  Result Value Ref Range   WBC 14.6 (H) 4.0 - 10.5 K/uL   RBC 2.75 (L) 3.87 - 5.11 MIL/uL   Hemoglobin 7.9 (L) 12.0 - 15.0 g/dL   HCT 84.1 (L) 66.0 - 63.0 %   MCV 85.8 80.0 - 100.0 fL   MCH 28.7 26.0 - 34.0 pg   MCHC 33.5 30.0 - 36.0 g/dL   RDW 16.0 (H) 10.9 - 32.3 %   Platelets 57 (L) 150 - 400 K/uL    Comment: Immature Platelet Fraction may be clinically indicated, consider ordering this additional test [id number]    nRBC 0.0 0.0 - 0.2 %    Comment: Performed at [facility], [address] [place], [place] [address]  Fibrinogen     Status: None   Collection Time: 09/04/22  1:40 AM  Result Value Ref Range   Fibrinogen 258 210 - 475 mg/dL    Comment: (NOTE) Fibrinogen results may be underestimated in  patients receiving thrombolytic therapy. Performed at [facility], [address] [place], [place] [address]   Protime-INR     Status: Abnormal   Collection Time: 09/04/22  1:40 AM  Result Value Ref Range   Prothrombin Time 15.9 (H) 11.4 - 15.2 seconds   INR 1.3 (H) 0.8 - 1.2    Comment: (NOTE) INR goal varies based on device and disease states. Performed at [facility], [address] [place], [place] [address]   Comprehensive metabolic panel     Status: Abnormal   Collection Time: 09/04/22  1:40 AM  Result Value Ref Range   Sodium 134 (L) 135 - 145 mmol/L   Potassium 4.4 3.5 - 5.1 mmol/L   Chloride 108 98 - 111 mmol/L   CO2 21 (L) 22 - 32 mmol/L   Glucose, Bld 94 70 - 99 mg/dL    Comment: Glucose reference range applies only to samples taken after fasting for at least 8 hours.   BUN 16 6 - 20 mg/dL   Creatinine, Ser 8.29 0.44 - 1.00 mg/dL   Calcium 7.9 (L) 8.9 - 10.3 mg/dL   Total Protein 5.2 (L) 6.5 - 8.1 g/dL   Albumin 2.5 (L) 3.5 - 5.0 g/dL   AST 47 (H) 15 - 41 U/L   ALT 15 0 - 44 U/L   Alkaline Phosphatase 196 (H) 38 - 126 U/L   Total Bilirubin 0.7 0.3 - 1.2 mg/dL   GFR, Estimated >56 >21 mL/min     Comment: (NOTE) Calculated using the CKD-EPI Creatinine Equation (2021)    Anion gap 5 5 - 15    Comment: Performed at [facility], [address] [place], [place] [address]  Protein / creatinine ratio, urine     Status: Abnormal   Collection Time: 09/04/22  1:40 AM  Result Value Ref Range   Creatinine, Urine 44 mg/dL   Total Protein, Urine 65 mg/dL    Comment: NO NORMAL RANGE ESTABLISHED FOR THIS TEST   Protein Creatinine Ratio 1.48 (H) 0.00 - 0.15 mg/mg[Cre]    Comment: Performed at [facility], [address] [address] [place], [place] [address]  Prepare platelet pheresis     Status: None   Collection Time: 09/04/22  3:03 AM  Result Value Ref Range   Unit Number [id number]    Blood Component Type PLTP1 PSORALEN TREATED    Unit division 00    Status of Unit ISSUED,FINAL    Transfusion Status      OK TO TRANSFUSE Performed at [facility], [address] [place], [place] [address]    Unit Number [id number]    Blood Component Type PLTP1 PSORALEN TREATED    Unit division 00    Status of Unit REL FROM [facility]    Transfusion Status OK TO TRANSFUSE   CBC     Status: Abnormal   Collection Time: 09/04/22  6:08 AM  Result Value Ref Range   WBC 22.8 (H) 4.0 - 10.5 K/uL   RBC 2.49 (L) 3.87 - 5.11 MIL/uL   Hemoglobin 7.4 (L) 12.0 - 15.0 g/dL   HCT 95.6 (L) 38.7 - 56.4 %   MCV 83.9 80.0 - 100.0 fL   MCH 29.7 26.0 - 34.0 pg   MCHC 35.4 30.0 - 36.0 g/dL   RDW 33.2 (H) 95.1 - 88.4 %   Platelets 92 (L) 150 - 400 K/uL    Comment: Immature Platelet Fraction may be clinically indicated, consider ordering this additional test [id number]  nRBC 0.0 0.0 - 0.2 %    Comment: Performed at [facility], [address] [place], [place] [address]  Fibrinogen     Status: None   Collection Time: 09/04/22  6:08 AM  Result Value Ref Range   Fibrinogen 316 210 - 475 mg/dL    Comment: (NOTE) Fibrinogen results may be underestimated in patients  receiving thrombolytic therapy. Performed at [facility], [address] [place], [place] [address]   Protime-INR     Status: None   Collection Time: 09/04/22  6:08 AM  Result Value Ref Range   Prothrombin Time 14.7 11.4 - 15.2 seconds   INR 1.1 0.8 - 1.2    Comment: (NOTE) INR goal varies based on device and disease states. Performed at [facility], [address] [place], [place] [address]   Comprehensive metabolic panel     Status: Abnormal   Collection Time: 09/04/22  6:08 AM  Result Value Ref Range   Sodium 134 (L) 135 - 145 mmol/L   Potassium 4.0 3.5 - 5.1 mmol/L   Chloride 106 98 - 111 mmol/L   CO2 20 (L) 22 - 32 mmol/L   Glucose, Bld 108 (H) 70 - 99 mg/dL    Comment: Glucose reference range applies only to samples taken after fasting for at least 8 hours.   BUN 13 6 - 20 mg/dL   Creatinine, Ser 9.14 0.44 - 1.00 mg/dL   Calcium 7.8 (L) 8.9 - 10.3 mg/dL   Total Protein 5.4 (L) 6.5 - 8.1 g/dL   Albumin 2.5 (L) 3.5 - 5.0 g/dL   AST 48 (H) 15 - 41 U/L   ALT 17 0 - 44 U/L   Alkaline Phosphatase 167 (H) 38 - 126 U/L   Total Bilirubin 0.4 0.3 - 1.2 mg/dL   GFR, Estimated >78 >29 mL/min    Comment: (NOTE) Calculated using the CKD-EPI Creatinine Equation (2021)    Anion gap 8 5 - 15    Comment: Performed at [facility], [address] [place], [place] [address]  Protein / creatinine ratio, urine     Status: None   Collection Time: 09/04/22  6:08 AM  Result Value Ref Range   Creatinine, Urine 17 mg/dL   Total Protein, Urine <6 mg/dL    Comment: NO NORMAL RANGE ESTABLISHED FOR THIS TEST   Protein Creatinine Ratio        0.00 - 0.15 mg/mg[Cre]    Comment: RESULT BELOW REPORTABLE RANGE, UNABLE TO CALCULATE. Performed at [facility], [address] [place], [place] [address]   CBC     Status: Abnormal   Collection Time: 09/04/22 10:28 AM  Result Value Ref Range   WBC 22.0 (H) 4.0 - 10.5 K/uL   RBC 2.40 (L) 3.87 - 5.11  MIL/uL   Hemoglobin 7.0 (L) 12.0 - 15.0 g/dL   HCT 84.6 (L) 96.2 - 95.2 %   MCV 83.8 80.0 - 100.0 fL   MCH 29.2 26.0 - 34.0 pg   MCHC 34.8 30.0 - 36.0 g/dL   RDW 84.1 (H) 32.4 - 40.1 %   Platelets 94 (L) 150 - 400 K/uL    Comment: Immature Platelet Fraction may be clinically indicated, consider ordering this additional test [id number]    nRBC 0.0 0.0 - 0.2 %    Comment: Performed at [facility], [address] [place], [place] [address]  Comprehensive metabolic panel     Status: Abnormal   Collection Time: 09/04/22  10:28 AM  Result Value Ref Range   Sodium 134 (L) 135 - 145 mmol/L   Potassium 3.8 3.5 - 5.1 mmol/L   Chloride 106 98 - 111 mmol/L   CO2 21 (L) 22 - 32 mmol/L   Glucose, Bld 142 (H) 70 - 99 mg/dL    Comment: Glucose reference range applies only to samples taken after fasting for at least 8 hours.   BUN 11 6 - 20 mg/dL   Creatinine, Ser 3.87 0.44 - 1.00 mg/dL   Calcium 7.6 (L) 8.9 - 10.3 mg/dL   Total Protein 5.3 (L) 6.5 - 8.1 g/dL   Albumin 2.6 (L) 3.5 - 5.0 g/dL   AST 54 (H) 15 - 41 U/L   ALT 18 0 - 44 U/L   Alkaline Phosphatase 159 (H) 38 - 126 U/L   Total Bilirubin 0.6 0.3 - 1.2 mg/dL   GFR, Estimated >56 >43 mL/min    Comment: (NOTE) Calculated using the CKD-EPI Creatinine Equation (2021)    Anion gap 7 5 - 15    Comment: Performed at [facility], [address] [place], [place] [address]  Fibrinogen     Status: None   Collection Time: 09/04/22 10:28 AM  Result Value Ref Range   Fibrinogen 355 210 - 475 mg/dL    Comment: (NOTE) Fibrinogen results may be underestimated in patients receiving thrombolytic therapy. Performed at [facility], [address] [place], [place] [address]   CBC with Differential/Platelet     Status: Abnormal   Collection Time: 09/04/22 10:21 PM  Result Value Ref Range   WBC 19.7 (H) 4.0 - 10.5 K/uL   RBC 2.09 (L) 3.87 - 5.11 MIL/uL   Hemoglobin 6.1 (L) 12.0 - 15.0 g/dL   HCT 60.6 (L) 30.1 -  46.0 %   MCV 85.6 80.0 - 100.0 fL   MCH 29.2 26.0 - 34.0 pg   MCHC 34.1 30.0 - 36.0 g/dL   RDW 60.1 (H) 09.3 - 23.5 %   Platelets 92 (L) 150 - 400 K/uL    Comment: Immature Platelet Fraction may be clinically indicated, consider ordering this additional test [id number]    nRBC 0.2 0.0 - 0.2 %   Neutrophils Relative % 81 %   Neutro Abs 16.1 (H) 1.7 - 7.7 K/uL   Lymphocytes Relative 10 %   Lymphs Abs 1.9 0.7 - 4.0 K/uL   Monocytes Relative 7 %   Monocytes Absolute 1.4 (H) 0.1 - 1.0 K/uL   Eosinophils Relative 0 %   Eosinophils Absolute 0.0 0.0 - 0.5 K/uL   Basophils Relative 0 %   Basophils Absolute 0.0 0.0 - 0.1 K/uL   Immature Granulocytes 2 %   Abs Immature Granulocytes 0.33 (H) 0.00 - 0.07 K/uL    Comment: Performed at [facility], [address] [place], [place] [address]  Comprehensive metabolic panel     Status: Abnormal   Collection Time: 09/04/22 10:21 PM  Result Value Ref Range   Sodium 138 135 - 145 mmol/L   Potassium 3.6 3.5 - 5.1 mmol/L   Chloride 107 98 - 111 mmol/L   CO2 23 22 - 32 mmol/L   Glucose, Bld 100 (H) 70 - 99 mg/dL    Comment: Glucose reference range applies only to samples taken after fasting for at least 8 hours.   BUN 12 6 - 20 mg/dL   Creatinine, Ser 2.37 0.44 - 1.00 mg/dL   Calcium 6.9 (L) 8.9 - 10.3 mg/dL  Total Protein 5.2 (L) 6.5 - 8.1 g/dL   Albumin 2.4 (L) 3.5 - 5.0 g/dL   AST 56 (H) 15 - 41 U/L   ALT 17 0 - 44 U/L   Alkaline Phosphatase 132 (H) 38 - 126 U/L   Total Bilirubin 0.4 0.3 - 1.2 mg/dL   GFR, Estimated >16 >10 mL/min    Comment: (NOTE) Calculated using the CKD-EPI Creatinine Equation (2021)    Anion gap 8 5 - 15    Comment: Performed at [facility], [address] [place], [place] [address]  Prepare RBC (crossmatch)     Status: None   Collection Time: 09/04/22 11:03 PM  Result Value Ref Range   Order Confirmation      ORDER PROCESSED BY BLOOD BANK Performed at [facility], [address] [place], [place] [address]   Lactic acid, plasma     Status: None   Collection Time: 09/04/22 11:50 PM  Result Value Ref Range   Lactic Acid, Venous 1.2 0.5 - 1.9 mmol/L    Comment: Performed at [facility], [address] [place], [place] [address]  CBC with Differential/Platelet     Status: Abnormal   Collection Time: 09/05/22  6:27 AM  Result Value Ref Range   WBC 14.2 (H) 4.0 - 10.5 K/uL   RBC 2.57 (L) 3.87 - 5.11 MIL/uL   Hemoglobin 7.6 (L) 12.0 - 15.0 g/dL   HCT 82.9 (L) 56.2 - 13.0 %   MCV 83.7 80.0 - 100.0 fL   MCH 29.6 26.0 - 34.0 pg   MCHC 35.3 30.0 - 36.0 g/dL   RDW 86.5 78.4 - 69.6 %   Platelets 80 (L) 150 - 400 K/uL    Comment: Immature Platelet Fraction may be clinically indicated, consider ordering this additional test [id number]    nRBC 0.3 (H) 0.0 - 0.2 %   Neutrophils Relative % 88 %   Neutro Abs 12.5 (H) 1.7 - 7.7 K/uL   Lymphocytes Relative 7 %   Lymphs Abs 1.0 0.7 - 4.0 K/uL   Monocytes Relative 3 %   Monocytes Absolute 0.5 0.1 - 1.0 K/uL   Eosinophils Relative 0 %   Eosinophils Absolute 0.0 0.0 - 0.5 K/uL   Basophils Relative 0 %   Basophils Absolute 0.0 0.0 - 0.1 K/uL   Immature Granulocytes 2 %   Abs Immature Granulocytes 0.22 (H) 0.00 - 0.07 K/uL    Comment: Performed at [facility], [address] [place], [place] [address]  Comprehensive metabolic panel     Status: Abnormal   Collection Time: 09/05/22  6:27 AM  Result Value Ref Range   Sodium 138 135 - 145 mmol/L   Potassium 3.4 (L) 3.5 - 5.1 mmol/L   Chloride 107 98 - 111 mmol/L   CO2 26 22 - 32 mmol/L   Glucose, Bld 90 70 - 99 mg/dL    Comment: Glucose reference range applies only to samples taken after fasting for at least 8 hours.   BUN 10 6 - 20 mg/dL   Creatinine, Ser 0.27 0.44 - 1.00 mg/dL   Calcium 6.7 (L) 8.9 - 10.3 mg/dL   Total Protein 5.0 (L) 6.5 - 8.1 g/dL   Albumin 2.3 (L) 3.5 - 5.0 g/dL   AST 59 (H) 15 - 41 U/L   ALT 18 0 - 44 U/L   Alkaline  Phosphatase 121 38 - 126 U/L   Total Bilirubin 0.2 (L) 0.3 - 1.2 mg/dL  GFR, Estimated >60 >60 mL/min    Comment: (NOTE) Calculated using the CKD-EPI Creatinine Equation (2021)    Anion gap 5 5 - 15    Comment: Performed at [facility], [address] [place], [place] [address]  Lactic acid, plasma     Status: None   Collection Time: 09/05/22  6:27 AM  Result Value Ref Range   Lactic Acid, Venous 1.2 0.5 - 1.9 mmol/L    Comment: Performed at [facility], [address] [place], [place] [address]    [place] OR NERVE BLOCK-IMAGE ONLY [facility])  Result Date: 09/04/2022 There is no interpretation for this exam.  This order is for images obtained during a surgical procedure.  Please See "Surgeries" Tab for more information regarding the procedure.   DG CHEST PORT 1 VIEW  Result Date: 09/03/2022 CLINICAL DATA:  Central venous catheter placement EXAM: PORTABLE CHEST 1 VIEW COMPARISON:  None Available. FINDINGS: Right internal jugular central venous catheter tip noted at the superior cavoatrial junction. Lung volumes are extremely small, but are symmetric and are clear. No pneumothorax or pleural effusion. Cardiac size within normal limits. No acute bone abnormality. IMPRESSION: 1. Right internal jugular central venous catheter tip at the superior cavoatrial junction. No pneumothorax. Electronically Signed   By: Helyn Numbers M.D.   On: 09/03/2022 22:13    Current scheduled medications  acetaminophen  1,000 mg Oral Q6H   ferrous sulfate  325 mg Oral BID WC   gabapentin  300 mg Oral QHS   ibuprofen  600 mg Oral Q6H   measles, mumps & rubella vaccine  0.5 mL Subcutaneous Once   prenatal multivitamin  1 tablet Oral Q1200   scopolamine  1 patch Transdermal Once   senna-docusate  2 tablet Oral Q24H   simethicone  80 mg Oral TID PC   sodium citrate-citric acid  30 mL Oral 30 min Pre-Op   Tdap  0.5 mL Intramuscular Once    I have reviewed the patient's current  medications.  ASSESSMENT: Patient Active Problem List   Diagnosis Date Noted   Abdominal pain affecting pregnancy 09/03/2022   IUFD at 20 weeks or more of gestation 09/03/2022   Vitamin D deficiency 03/27/2021   Closed bicondylar fracture of left tibial plateau 03/26/2021    PLAN:  1) IUFD - Emotional Support   2) DIC - Monitoring Hemoglobin - currently up at 7.6 from 6.1 - Monitoring Platelets - currently 80 down from 92 - Monitoring Liver enzymes - currently 59 up from 56 - Will hold on Tylenol at this time - Repeat CBC in the at noon  3) Pre-eclampsia - Mag Sulfate x24 hours, turned off last night at 0345 - BPs since turning off Mag - 120/63, 127/73 - I&O last shift - at 156mL/hr - Continue to monitor Liver enzymes - currently 59 up from 56  4) Possible infection - Afebrile  - Monitoring WBCs - currently down at 14.2 from 19.7 - Monitoring Lactic acid - currently stable at 1.2 - Ampicillin Q6 hrs x2 days to end tonight around 2045 - Clindamycin Q 8hrs - Gentamicin Q24 hrs  5) Pain Management - Hold on Tylenol for elevated liver enzymes - Fentanyl Q1 hr - last given yesterday - Gabapentin at HS - Hydromorphone Q3 hrs - last given this am - Ibuprofen Q6 hrs - Oxycodone Q 4 hrs  6) Severe Anemia  - Monitoring Hemoglobin - currently up at 7.6 from 6.1 - S/p 3u of pRBCs PP -  last unit this am - Ferrous sulfate BID  7) Diarrhea - Likely d/t Hemabate  - Imodium ordered  8) Postpartum Care - Continue PNV - MMR and Varicella immunization prior to discharge      Haroldine Laws Certified Nurse Midwife [facility]

## 2022-09-05 NOTE — Progress Notes (Signed)
   09/05/22 1200  Spiritual Encounters  Type of Visit Initial  Care provided to: Pt and family  Conversation partners present during encounter Nurse  Referral source Nurse (RN/NT/LPN)  Reason for visit Urgent spiritual support  OnCall Visit No  Spiritual Framework  Presenting Themes Impactful experiences and emotions  Community/Connection Family  Interventions  Spiritual Care Interventions Made Established relationship of care and support;Compassionate presence;Reflective listening;Bereavement/grief support;Supported grief process  Intervention Outcomes  Outcomes Awareness of support  Spiritual Care Plan  Spiritual Care Issues Still Outstanding No further spiritual care needs at this time (see row info)   Chaplain visited with patient and family.  Patient is waiting for her mother to arrive before making ongoing decisions post pregnancy. Offered empathy and compassionate presence. Chaplain spiritual support services remain available as the need arises.

## 2022-09-05 NOTE — Progress Notes (Signed)
RIJ central line removed, catheter intact. Minimal bleeding upon removal. Gauze and Tegaderm placed over site. Patient tolerated well.

## 2022-09-06 DIAGNOSIS — D62 Acute posthemorrhagic anemia: Secondary | ICD-10-CM | POA: Diagnosis present

## 2022-09-06 DIAGNOSIS — O1413 Severe pre-eclampsia, third trimester: Secondary | ICD-10-CM | POA: Diagnosis present

## 2022-09-06 DIAGNOSIS — O45029 Premature separation of placenta with disseminated intravascular coagulation, unspecified trimester: Secondary | ICD-10-CM | POA: Diagnosis present

## 2022-09-06 DIAGNOSIS — O1423 HELLP syndrome (HELLP), third trimester: Secondary | ICD-10-CM | POA: Diagnosis present

## 2022-09-06 MED ORDER — LABETALOL HCL 100 MG PO TABS
100.0000 mg | ORAL_TABLET | Freq: Two times a day (BID) | ORAL | Status: DC
  Administered 2022-09-06: 100 mg via ORAL
  Filled 2022-09-06: qty 1

## 2022-09-06 MED ORDER — FUROSEMIDE 20 MG PO TABS
20.0000 mg | ORAL_TABLET | Freq: Every day | ORAL | Status: DC
  Administered 2022-09-06: 20 mg via ORAL
  Filled 2022-09-06: qty 1

## 2022-09-06 MED ORDER — FUROSEMIDE 20 MG PO TABS: 20.0000 mg | ORAL_TABLET | Freq: Every day | ORAL | 0 refills | Status: DC

## 2022-09-06 MED ORDER — IBUPROFEN 600 MG PO TABS: 600.0000 mg | ORAL_TABLET | Freq: Four times a day (QID) | ORAL | 2 refills | Status: DC | PRN

## 2022-09-06 MED ORDER — SIMETHICONE 80 MG PO CHEW: 80.0000 mg | CHEWABLE_TABLET | Freq: Three times a day (TID) | ORAL | Status: DC

## 2022-09-06 MED ORDER — LABETALOL HCL 100 MG PO TABS: 100.0000 mg | ORAL_TABLET | Freq: Two times a day (BID) | ORAL | 1 refills | Status: DC

## 2022-09-06 MED ORDER — OXYCODONE HCL 5 MG PO TABS: 5.0000 mg | ORAL_TABLET | ORAL | 0 refills | Status: DC | PRN

## 2022-09-06 MED ORDER — GABAPENTIN 300 MG PO CAPS: 300.0000 mg | ORAL_CAPSULE | Freq: Every day | ORAL | 0 refills | Status: DC

## 2022-09-06 NOTE — Progress Notes (Signed)
Postop Day  3  Subjective: 27 y.o. G1P0100 postpartum day #3 status post primary cesarean section. She is ambulating, is tolerating po, is voiding spontaneously.  Her pain is well controlled on PO pain medications. Her lochia is less than menses.  Objective: BP 128/78   Pulse 86   Temp 98.3 F (36.8 C) (Oral)   Resp 16   Ht 5\' 1"  (1.549 m)   Wt 91 kg   LMP 01/01/2022 (Exact Date) Comment: pt just gave birth  SpO2 95%   Breastfeeding Unknown   BMI 37.91 kg/m    Vitals:   09/05/22 0030 09/05/22 0046 09/05/22 0245 09/05/22 0420  BP: 117/71 114/66 124/69 120/63   09/05/22 0738 09/05/22 1108 09/05/22 1635 09/05/22 2026  BP: 127/73 134/89 (!) 140/78 132/82   09/06/22 0213 09/06/22 0613 09/06/22 0922 09/06/22 1046  BP: (!) 142/73 113/74 139/87 128/78     Physical Exam:  General: cooperative, fatigued, no distress, and pale Pulm: nl effort Abdomen: soft, non-tender, active bowel sounds Uterine Fundus: firm Incision: no significant drainage, covered with occlusive dressing  Perineum: intact  Lochia: appropriate DVT Evaluation: No evidence of DVT seen on physical exam. Extremities: Bilateral 2+ pitting edema      Latest Ref Rng & Units 09/06/2022    6:01 AM 09/05/2022   10:59 PM 09/05/2022    1:18 PM  CBC  WBC 4.0 - 10.5 K/uL 16.1  19.9  19.4   Hemoglobin 12.0 - 15.0 g/dL 7.1  7.5  7.5   Hematocrit 36.0 - 46.0 % 20.2  22.2  21.9   Platelets 150 - 400 K/uL 112  100  89        Latest Ref Rng & Units 09/05/2022   10:59 PM 09/05/2022    1:18 PM 09/05/2022    6:27 AM  CMP  Glucose 70 - 99 mg/dL 469  90  90   BUN 6 - 20 mg/dL 8  8  10    Creatinine 0.44 - 1.00 mg/dL 6.29  5.28  4.13   Sodium 135 - 145 mmol/L 135  135  138   Potassium 3.5 - 5.1 mmol/L 3.6  3.7  3.4   Chloride 98 - 111 mmol/L 105  104  107   CO2 22 - 32 mmol/L 23  25  26    Calcium 8.9 - 10.3 mg/dL 7.4  7.0  6.7   Total Protein 6.5 - 8.1 g/dL 5.3  5.0  5.0   Total Bilirubin 0.3 - 1.2 mg/dL 0.5  0.5  0.2    Alkaline Phos 38 - 126 U/L 131  130  121   AST 15 - 41 U/L 77  61  59   ALT 0 - 44 U/L 27  20  18      Assessment/Plan: 27 y.o. G1P0100 postpartum day # 3  1. Continue routine postpartum care -Plan of care discussed with Dr. Dalbert Garnet   2. IIUFD -appropriately grieving  -Baby to be transferred to funeral home today   3. Preeclampsia -Mag sulfate d/c  -BP's postpartum 120-140's/80's -Will start Labetalol 100 mg BID  -Lasix PO daily for 5 days  -Will plan to repeat LFT outpatient follow up this week   4. Acute blood loss anemia - clinically significant.  -S/P abruption and DIC  -received 3 units of pRBC PP -Hemodynamically stable and asymptomatic -Intervention: continue on oral supplementation with ferrous sulfate 325  5. Possible infection - Remains afebrile  - WBC trending down  - Lactic acid stable -  previously 4.8 and trended down to 1.2 - Ampicillin Q6 x 2 days - last dose will be 1200 today  - D/C Gent and clindamycin  - Will repeat CBC outpatient follow up this week   6. Immunization status:   all immunizations up to date  Disposition: desires discharge home today    LOS: 3 days   Gustavo Lah, [place] 09/06/2022, 11:15 AM   ----- Margaretmary Eddy  Certified Nurse Midwife [facility]

## 2022-09-06 NOTE — Progress Notes (Signed)
Discharge home. Flowers given. Left floor via wheelchair with family. Elaina Hoops

## 2022-09-06 NOTE — Discharge Summary (Signed)
Postpartum Discharge Summary    Patient Name: Savannah Hoffman DOB: August 17, 1995 MRN: [id number]  Date of admission: 09/03/2022 Delivery date:09/03/2022 Delivering provider: Christeen Douglas Date of discharge: 09/06/2022  Admitting diagnosis: Abdominal pain affecting pregnancy [O26.899, R10.9] IUFD at 20 weeks or more of gestation [O36.4XX0] Intrauterine pregnancy: [redacted]w[redacted]d     Secondary diagnosis:  Principal Problem:   Abdominal pain affecting pregnancy Active Problems:   IUFD at 20 weeks or more of gestation   Disseminated intravascular coagulation due to placental abruption   Preeclampsia, severe, third trimester   HELLP syndrome (HELLP), third trimester   Acute blood loss anemia (ABLA)   Cesarean delivery delivered   Postpartum hemorrhage associated with low clotting factor    Discharge diagnosis: Preterm Pregnancy Delivered, Preeclampsia (severe), Anemia, and IUFD d/t placental abruption                                               Post partum procedures:blood transfusion Induction: AROM, Pitocin, and Cytotec Complications: Placental Abruption, Hemorrhage>1054mL, and [facility] course: Induction of Labor With Cesarean Section   27 y.o. yo G1P0100 at [redacted]w[redacted]d was admitted to the hospital 09/03/2022 for IUFD after a placental abruption. Decision made to proceed with induction of labor. Patient had a labor course significant for severe acute blood loss anemia that required blood transfusions prior to delivery. The patient went for cesarean section due to  worsening DIC, preeclampsia, HELLP syndrome and remote from delivery . Delivery details are as follows: Membrane Rupture Time/Date: 4:23 PM,09/03/2022  Delivery Method:C-Section, Low Transverse Operative Delivery:N/A Details of operation can be found in separate operative Note.  Patient had a postpartum course complicated by preeclampsia, possible sepsis, DIC, PPH, and acute blood loss anemia. She received Mag sulfate infusion for  24 hours postpartum.  She also received ampicillin, gentamicin, and clindamycin for 3 days postpartum.  Labetalol and lasix were started to manage hypertension and postpartum edema. As of POD#3, she is ambulating, tolerating a regular diet, passing flatus, and urinating well.  Patient is discharged home in stable condition on 09/06/22.      Newborn Data: Birth date:09/03/2022 Birth time:10:40 PM Gender:Female Living status:Fetal Demise Apgars: 0, 0                             Magnesium Sulfate received: Yes: Seizure prophylaxis BMZ received: No Rhophylac:N/A MMR:No T-DaP:Given prenatally Flu: N/A Transfusion:Yes  -3 units pRBC -3 units cryoprecipitate  -3 units platelets   Physical exam  Vitals:   09/06/22 0922 09/06/22 1046 09/06/22 1048 09/06/22 1430  BP: 139/87 128/78 128/78 138/89  Pulse: 85 86 86 (!) 102  Resp:      Temp:    98.4 F (36.9 C)  TempSrc:    Oral  SpO2:      Weight:      Height:       General: alert, cooperative, and no distress Lochia: appropriate Uterine Fundus: firm Incision: No significant erythema, Dressing is clean, dry, and intact DVT Evaluation: No evidence of DVT seen on physical exam. Labs: Lab Results  Component Value Date   WBC 16.1 (H) 09/06/2022   HGB 7.1 (L) 09/06/2022   HCT 20.2 (L) 09/06/2022   MCV 84.5 09/06/2022   PLT 112 (L) 09/06/2022      Latest Ref Rng & Units 09/05/2022  10:59 PM  CMP  Glucose 70 - 99 mg/dL 161   BUN 6 - 20 mg/dL 8   Creatinine 0.96 - 0.45 mg/dL 4.09   Sodium 811 - 914 mmol/L 135   Potassium 3.5 - 5.1 mmol/L 3.6   Chloride 98 - 111 mmol/L 105   CO2 22 - 32 mmol/L 23   Calcium 8.9 - 10.3 mg/dL 7.4   Total Protein 6.5 - 8.1 g/dL 5.3   Total Bilirubin 0.3 - 1.2 mg/dL 0.5   Alkaline Phos 38 - 126 U/L 131   AST 15 - 41 U/L 77   ALT 0 - 44 U/L 27    Edinburgh Score:     No data to display            After visit meds:  Allergies as of 09/06/2022   No Known Allergies      Medication List      STOP taking these medications    Acetaminophen Extra Strength 500 MG Tabs   ascorbic acid 1000 MG tablet Commonly known as: VITAMIN C   methocarbamol 500 MG tablet Commonly known as: ROBAXIN   metroNIDAZOLE 500 MG tablet Commonly known as: FLAGYL   ondansetron 4 MG tablet Commonly known as: ZOFRAN   oxyCODONE-acetaminophen 5-325 MG tablet Commonly known as: PERCOCET/ROXICET   Vitamin D (Ergocalciferol) 1.25 MG (50000 UNIT) Caps capsule Commonly known as: DRISDOL   Xarelto 15 MG Tabs tablet Generic drug: Rivaroxaban       TAKE these medications    docusate sodium 100 MG capsule Commonly known as: COLACE Take 1 capsule (100 mg total) by mouth 2 (two) times daily.   escitalopram 10 MG tablet Commonly known as: LEXAPRO Take 1 tablet (10 mg total) by mouth once daily   furosemide 20 MG tablet Commonly known as: LASIX Take 1 tablet (20 mg total) by mouth daily for 4 days.   gabapentin 300 MG capsule Commonly known as: NEURONTIN Take 1 capsule (300 mg total) by mouth at bedtime for 7 days.   ibuprofen 600 MG tablet Commonly known as: ADVIL Take 1 tablet (600 mg total) by mouth every 6 (six) hours as needed for mild pain or cramping.   labetalol 100 MG tablet Commonly known as: NORMODYNE Take 1 tablet (100 mg total) by mouth 2 (two) times daily.   oxyCODONE 5 MG immediate release tablet Commonly known as: Oxy IR/ROXICODONE Take 1 tablet (5 mg total) by mouth every 4 (four) hours as needed for up to 7 days for moderate pain.   Prenatal 27-1 MG Tabs Take 1 tablet by mouth daily. What changed: Another medication with the same name was removed. Continue taking this medication, and follow the directions you see here.   Classic Prenatal 28-0.8 MG Tabs Take 1 tablet by mouth once daily What changed: Another medication with the same name was removed. Continue taking this medication, and follow the directions you see here.   simethicone 80 MG chewable  tablet Commonly known as: MYLICON Chew 1 tablet (80 mg total) by mouth 3 (three) times daily after meals.   Vitamin D 50 MCG (2000 UT) tablet Take 2.5 tablets (5,000 Units total) by mouth daily.   Zinc Sulfate 220 (50 Zn) MG Tabs Take 1 tablet (220 mg total) by mouth daily.         Discharge home in stable condition Infant Disposition:morgue - plan to transfer to funeral home  Discharge instruction: per After Visit Summary and Postpartum booklet. Activity: Advance as tolerated. Pelvic  rest for 6 weeks.  Diet: routine diet Anticipated Birth Control: Unsure Postpartum Appointment:2 weeks - instructed to call office Thursday or Friday for BP check Additional Postpartum F/U: Postpartum Depression checkup and BP check 1 week Future Appointments:No future appointments. Follow up Visit:  Follow-up Information     Christeen Douglas, MD. Schedule an appointment as soon as possible for a visit in 2 week(s).   Specialty: Obstetrics and Gynecology Why: post-op incision check Contact information: [address] [place] [address] [phone]         Christeen Douglas, MD. Schedule an appointment as soon as possible for a visit in 6 week(s).   Specialty: Obstetrics and Gynecology Why: postpartum visit Contact information: [address] [place] [address] [phone]         [facility]. Call.   Why: on Thursday or Friday to review blood pressures.  May send MyChart message. Contact information: [address] [place] [address] [phone]                09/06/2022 Gustavo Lah, CNM  .Margaretmary Eddy, CNM Certified Nurse Midwife [facility]

## 2022-09-06 NOTE — Discharge Instructions (Addendum)
Cesarean Delivery, Care After Refer to this sheet in the next few weeks. These instructions provide you with information on caring for yourself after your procedure. Your health care provider may also give you specific instructions. Your treatment has been planned according to current medical practices, but problems sometimes occur. Call your health care provider if you have any problems or questions after you go home. HOME CARE INSTRUCTIONS  Please leave honey comb dressing (OP Site) on for 7 days.  You may shower during this period but turn your back to the water so that the dressing does not get directly saturated by the water.   You may take the dressing off on day 7.  The easiest way to do it is in the shower.  Allow the water to run over the dressing and it usually comes off easier.   Only take over-the-counter or prescription medications as directed by your health care provider. Do not drink alcohol, especially if you are breastfeeding or taking medication to relieve pain. Do not  smoke tobacco. Continue to use good perineal care. Good perineal care includes: Wiping your perineum from front to back. Keeping your perineum clean. Check your surgical cut (incision) daily for increased redness, drainage, swelling, or separation of skin. Shower and clean your incision gently with soap and water every day, by letting warm and soapy water run over the incision, and then pat it dry. If your health care provider says it is okay, leave the incision uncovered. Use a bandage (dressing) if the incision is draining fluid or appears irritated. If the adhesive strips across the incision do not fall off within 7 days, carefully peel them off, after a shower. Hug a pillow when coughing or sneezing until your incision is healed. This helps to relieve pain. Do not use tampons, douches or have sexual intercourse, until your health care provider says it is okay. Wear a well-fitting bra that provides breast  support. Limit wearing support panties or control-top hose. Drink enough fluids to keep your urine clear or pale yellow. Eat high-fiber foods such as whole grain cereals and breads, brown rice, beans, and fresh fruits and vegetables every day. These foods may help prevent or relieve constipation. Resume activities such as climbing stairs, driving, lifting, exercising, or traveling as directed by your health care provider. Rest as much as possible. Increase your activities gradually. Do not lift more than 15lbs until directed by a provider. Keep all of your scheduled postpartum appointments. It is very important to keep your scheduled follow-up appointments. At these appointments, your health care provider will be checking to make sure that you are healing physically and emotionally. SEEK MEDICAL CARE IF:  You are passing large clots from your vagina. Save any clots to show your health care provider. You have a foul smelling discharge from your vagina. You have trouble urinating. You are urinating frequently. You have pain when you urinate. You have a change in your bowel movements. You have increasing redness, pain, or swelling near your incision. You have pus draining from your incision. Your incision is separating. You have painful, hard, or reddened breasts. You have a severe headache. You have blurred vision or see spots. You feel sad or depressed. You have thoughts of hurting yourself  You have questions about your care or medications. You are dizzy or light-headed. You have a rash. You have pain, redness, or swelling at the site of the removed intravenous access (IV) tube. You have nausea or vomiting. You have a  fever. SEEK IMMEDIATE MEDICAL CARE IF: You have persistent pain. You have chest pain. You have shortness of breath. You faint. You have leg pain. You have stomach pain. Your vaginal bleeding saturates 2 or more sanitary pads in 1 hour. MAKE SURE YOU:  Understand  these instructions. Will watch your condition. Will get help right away if you are not doing well or get worse. Document Released: 10/05/2001 Document Revised: 05/30/2013 Document Reviewed: 09/10/2011 [facility] Patient Information 2015 [place], [place]. This information is not intended to replace advice given to you by your health care provider. Make sure you discuss any questions you have with your health care provider.   Lactation after loss: a guide for grieving parents   One of the most challenging times for many grieving parents is when their milk comes in. Many feel unprepared to cope during this physically and emotionally difficult time.  There are different options to help your body and your emotions through this experience. Some parents feel upset by the presence of milk and they want to make it go away as quickly as possible. Others find the milk to be a comforting reminder of their body's ability to care for their baby. For some parents, it can be very healing to pump their milk and donate it to a donor human milk bank to help sick or premature babies.   There is no right or wrong way to feel. Take the time you need to transition through the phases of making milk in a way that brings you the most physical and emotional comfort.  What can I expect after my pregnancy has ended? During your pregnancy, your breasts were preparing to make milk to feed your baby. When your pregnancy ends, hormones cause your body to start making milk. For the first few days, you will notice the first milk that comes in, called colostrum, which is thick and yellow. A few days later, your mature milk will start to come in, and your body will make larger amounts of this milk.  How will my breasts feel? Your body only makes a small amount of colostrum, so there will be very little discomfort during the first few days. As the milk starts to come in, some  parents experience only a small feeling of fullness, while  others find their breasts become hard with tightly stretched skin that may look shiny or feel warm. This is called engorgement, and it can extend up into the armpit and out to the end of the nipple. Many parents feel increased tenderness or throbbingaround their breasts, and some develop a low fever (between 100-101 F).  What are some things I can do to help my body stop making milk?  For the first few days after your pregnancy has ended, do not use a breast pump (unless you are thinking about donating your milk).  Your body makes milk in response to the amount of milk that's removed from your body. To help your body stop making milk, you'll need to slowly decrease your milk supply. This is called gradual weaning, whichwill help you avoid discomfort and complications like plugged ducts or infection in the breasts.  If I already have a regular milk supply, how can I slowly decrease my  milk supply? Use a double electric breast pump to continue removing milk, but change your pumping schedule slowly. For example, if you have been breastfeeding or chestfeeding your baby or pumping 8 times in 24 hours, reduce that to pumping 7 times in the  next 24 hours. Continue reducing your pumping schedule to 6 times, and then to 5 times, and then to 4 times every 24 hours. After that, pump just long enough to reduce discomfort as needed for the next few days. It may take a week or more, but your body will eventually stop producing milk as you stop removing it.   What are some things I can do to help relieve the pain and pressure of  engorgement?  Try wearing a well-fitting, supportive bra, but do not bind your breasts.   Use cold packs on your breasts for 20 minutes at a time (make sure there's a layer of fabric between your skin and the cold pack).   Wrap each breast in washed, cold, raw cabbage leaves. Change leaves when they become warm and wilted.  Avoid using heat (like heating pads, etc.). Heat can increase  swelling and inflammation in your breasts, which can lead to more discomfort.  As your milk starts to come in, pump just enough milk by hand or with a breast pump to make you feel more comfortable. Do not completely empty your breasts.   Can I take any medication for the engorgement pain? You can take acetaminophen (Tylenol) or ibuprofen (Motrin or Advil). Follow the instructions on the bottle or medication package.   I would like to donate milk. How do I start the process?  Some parents find it very healing to pump their breast milk and donate it to another baby in need. Donating your milk to a human milk bank can be lifesaving for premature and very ill babies.If you are thinking about donating your milk, start using a breast pump as soon as possible after your pregnancy ends. Removing milk from the breasts thoroughly and frequently is very important to create and keep up your milk supply. We recommend you pump at least every 3 hours for 15 minutes at a  time if you want to continue producing milk.    [employer] [place] is a [employer] created to ensure the quality of donor human milk.  There is a screening process involved before becoming a milk donor. Their main center is in [place], [place] but they have multiple sites for donations.  You go to their website, www. Website: www.[facility].org/mother-milk-bank Phone number: (801)825-3158  When should I call my healthcare provider?  For general questions, please call [facility] at [phone]. They will ask you to leave a message, and a team member will return your call   If you notice signs of infection (including fever, redness, or swelling), contact your healthcare provider.

## 2022-09-07 ENCOUNTER — Other Ambulatory Visit: Payer: Self-pay

## 2022-09-07 DIAGNOSIS — O1413 Severe pre-eclampsia, third trimester: Secondary | ICD-10-CM

## 2022-09-07 MED ORDER — FUROSEMIDE 20 MG PO TABS
20.0000 mg | ORAL_TABLET | Freq: Every day | ORAL | 0 refills | Status: DC
Start: 2022-09-07 — End: 2023-07-21

## 2022-09-07 MED ORDER — GABAPENTIN 300 MG PO CAPS
300.0000 mg | ORAL_CAPSULE | Freq: Every day | ORAL | 0 refills | Status: DC
Start: 2022-09-07 — End: 2023-07-21

## 2022-09-07 MED ORDER — LABETALOL HCL 100 MG PO TABS
100.0000 mg | ORAL_TABLET | Freq: Two times a day (BID) | ORAL | 1 refills | Status: DC
Start: 2022-09-07 — End: 2023-07-21

## 2022-09-07 MED ORDER — OXYCODONE HCL 5 MG PO TABS
5.0000 mg | ORAL_TABLET | ORAL | 0 refills | Status: AC | PRN
Start: 2022-09-07 — End: 2022-09-14

## 2022-09-07 MED ORDER — IBUPROFEN 600 MG PO TABS
600.0000 mg | ORAL_TABLET | Freq: Four times a day (QID) | ORAL | 2 refills | Status: DC | PRN
Start: 2022-09-07 — End: 2023-07-21

## 2022-09-07 NOTE — Progress Notes (Signed)
Original pharmacy not open for Shadae to pick up her medications.  Prescriptions sent to [facility] in [place], [place].  Attempted to call [facility] to leave a message but unable leave message.   Margaretmary Eddy, CNM

## 2022-10-24 ENCOUNTER — Other Ambulatory Visit: Payer: Self-pay

## 2022-10-24 MED ORDER — PHENTERMINE HCL 37.5 MG PO TABS
37.5000 mg | ORAL_TABLET | Freq: Every morning | ORAL | 2 refills | Status: DC
  Filled 2022-10-24: qty 30, 30d supply, fill #0
  Filled 2022-12-16 (×2): qty 30, 30d supply, fill #1
  Filled 2023-01-27: qty 30, 30d supply, fill #2

## 2022-10-24 MED ORDER — NORELGESTROMIN-ETH ESTRADIOL 150-35 MCG/24HR TD PTWK
1.0000 | MEDICATED_PATCH | TRANSDERMAL | 3 refills | Status: DC
  Filled 2022-10-24: qty 9, 63d supply, fill #0

## 2022-11-03 ENCOUNTER — Encounter: Payer: Self-pay | Admitting: Obstetrics and Gynecology

## 2022-11-19 ENCOUNTER — Other Ambulatory Visit: Payer: Self-pay

## 2022-11-19 MED ORDER — METRONIDAZOLE 500 MG PO TABS
500.0000 mg | ORAL_TABLET | Freq: Two times a day (BID) | ORAL | 0 refills | Status: DC
  Filled 2022-11-19: qty 14, 7d supply, fill #0

## 2022-12-16 ENCOUNTER — Other Ambulatory Visit: Payer: Self-pay

## 2022-12-29 ENCOUNTER — Other Ambulatory Visit: Payer: Self-pay

## 2022-12-29 MED ORDER — METRONIDAZOLE 500 MG PO TABS
500.0000 mg | ORAL_TABLET | Freq: Two times a day (BID) | ORAL | 0 refills | Status: DC
  Filled 2022-12-29: qty 20, 10d supply, fill #0

## 2023-01-13 ENCOUNTER — Other Ambulatory Visit: Payer: Self-pay

## 2023-01-13 MED ORDER — OSELTAMIVIR PHOSPHATE 75 MG PO CAPS
75.0000 mg | ORAL_CAPSULE | Freq: Two times a day (BID) | ORAL | 0 refills | Status: DC
  Filled 2023-01-13: qty 10, 5d supply, fill #0

## 2023-01-27 ENCOUNTER — Other Ambulatory Visit: Payer: Self-pay

## 2023-01-29 ENCOUNTER — Other Ambulatory Visit: Payer: Self-pay

## 2023-01-29 MED ORDER — METRONIDAZOLE 500 MG PO TABS
500.0000 mg | ORAL_TABLET | Freq: Two times a day (BID) | ORAL | 0 refills | Status: DC
  Filled 2023-01-29: qty 14, 7d supply, fill #0

## 2023-01-29 MED ORDER — NUVESSA 1.3 % VA GEL
Freq: Once | VAGINAL | 0 refills | Status: AC
  Filled 2023-01-29 (×2): qty 5, 1d supply, fill #0

## 2023-01-30 ENCOUNTER — Other Ambulatory Visit: Payer: Self-pay

## 2023-02-23 ENCOUNTER — Other Ambulatory Visit: Payer: Self-pay

## 2023-02-23 MED ORDER — BONJESTA 20-20 MG PO TBCR
EXTENDED_RELEASE_TABLET | ORAL | 0 refills | Status: DC
  Filled 2023-02-23 (×2): qty 30, 15d supply, fill #0

## 2023-02-23 MED ORDER — BUTALBITAL-APAP-CAFFEINE 50-325-40 MG PO TABS
1.0000 | ORAL_TABLET | ORAL | 0 refills | Status: DC | PRN
  Filled 2023-02-23: qty 30, 5d supply, fill #0

## 2023-02-24 ENCOUNTER — Other Ambulatory Visit: Payer: Self-pay

## 2023-03-17 ENCOUNTER — Other Ambulatory Visit: Payer: Self-pay

## 2023-03-17 MED ORDER — TERCONAZOLE 0.8 % VA CREA
1.0000 | TOPICAL_CREAM | Freq: Every day | VAGINAL | 0 refills | Status: DC
  Filled 2023-03-17: qty 20, 3d supply, fill #0

## 2023-03-26 DIAGNOSIS — O0990 Supervision of high risk pregnancy, unspecified, unspecified trimester: Secondary | ICD-10-CM | POA: Insufficient documentation

## 2023-03-30 LAB — OB RESULTS CONSOLE VARICELLA ZOSTER ANTIBODY, IGG: Varicella: NON-IMMUNE/NOT IMMUNE

## 2023-03-30 LAB — OB RESULTS CONSOLE RUBELLA ANTIBODY, IGM: Rubella: NON-IMMUNE/NOT IMMUNE

## 2023-04-06 ENCOUNTER — Emergency Department

## 2023-04-06 ENCOUNTER — Other Ambulatory Visit: Payer: Self-pay

## 2023-04-06 DIAGNOSIS — Z5321 Procedure and treatment not carried out due to patient leaving prior to being seen by health care provider: Secondary | ICD-10-CM | POA: Diagnosis not present

## 2023-04-06 DIAGNOSIS — Z3A12 12 weeks gestation of pregnancy: Secondary | ICD-10-CM | POA: Diagnosis not present

## 2023-04-06 DIAGNOSIS — O209 Hemorrhage in early pregnancy, unspecified: Secondary | ICD-10-CM | POA: Diagnosis present

## 2023-04-06 LAB — CHLAMYDIA/NGC RT PCR (ARMC ONLY)
Chlamydia Tr: NOT DETECTED
N gonorrhoeae: NOT DETECTED

## 2023-04-06 LAB — CBC WITH DIFFERENTIAL/PLATELET
Abs Immature Granulocytes: 0.07 10*3/uL (ref 0.00–0.07)
Basophils Absolute: 0.1 10*3/uL (ref 0.0–0.1)
Basophils Relative: 0 %
Eosinophils Absolute: 0.1 10*3/uL (ref 0.0–0.5)
Eosinophils Relative: 1 %
HCT: 35.8 % — ABNORMAL LOW (ref 36.0–46.0)
Hemoglobin: 11.6 g/dL — ABNORMAL LOW (ref 12.0–15.0)
Immature Granulocytes: 1 %
Lymphocytes Relative: 25 %
Lymphs Abs: 3.4 10*3/uL (ref 0.7–4.0)
MCH: 26.9 pg (ref 26.0–34.0)
MCHC: 32.4 g/dL (ref 30.0–36.0)
MCV: 83.1 fL (ref 80.0–100.0)
Monocytes Absolute: 0.9 10*3/uL (ref 0.1–1.0)
Monocytes Relative: 6 %
Neutro Abs: 9.2 10*3/uL — ABNORMAL HIGH (ref 1.7–7.7)
Neutrophils Relative %: 67 %
Platelets: 316 10*3/uL (ref 150–400)
RBC: 4.31 MIL/uL (ref 3.87–5.11)
RDW: 14.6 % (ref 11.5–15.5)
WBC: 13.8 10*3/uL — ABNORMAL HIGH (ref 4.0–10.5)
nRBC: 0 % (ref 0.0–0.2)

## 2023-04-06 LAB — URINALYSIS, ROUTINE W REFLEX MICROSCOPIC
Bilirubin Urine: NEGATIVE
Glucose, UA: NEGATIVE mg/dL
Ketones, ur: NEGATIVE mg/dL
Nitrite: NEGATIVE
Protein, ur: NEGATIVE mg/dL
Specific Gravity, Urine: 1.015 (ref 1.005–1.030)
pH: 6 (ref 5.0–8.0)

## 2023-04-06 LAB — WET PREP, GENITAL
Clue Cells Wet Prep HPF POC: NONE SEEN
Trich, Wet Prep: NONE SEEN
WBC, Wet Prep HPF POC: <10 (ref <10)

## 2023-04-06 LAB — COMPREHENSIVE METABOLIC PANEL
ALT: 22 U/L (ref 0–44)
AST: 38 U/L (ref 15–41)
Albumin: 3.6 g/dL (ref 3.5–5.0)
Alkaline Phosphatase: 59 U/L (ref 38–126)
Anion gap: 10 (ref 5–15)
BUN: 7 mg/dL (ref 6–20)
CO2: 18 mmol/L — ABNORMAL LOW (ref 22–32)
Calcium: 8.7 mg/dL — ABNORMAL LOW (ref 8.9–10.3)
Chloride: 107 mmol/L (ref 98–111)
Creatinine, Ser: 0.5 mg/dL (ref 0.44–1.00)
GFR, Estimated: >60 mL/min (ref >60)
Glucose, Bld: 114 mg/dL — ABNORMAL HIGH (ref 70–99)
Potassium: 3.4 mmol/L — ABNORMAL LOW (ref 3.5–5.1)
Sodium: 135 mmol/L (ref 135–145)
Total Bilirubin: 0.2 mg/dL (ref 0.0–1.2)
Total Protein: 7.4 g/dL (ref 6.5–8.1)

## 2023-04-06 LAB — POC URINE PREG, ED: Preg Test, Ur: POSITIVE — AB

## 2023-04-06 LAB — LIPASE, BLOOD: Lipase: 39 U/L (ref 11–51)

## 2023-04-06 LAB — ABO/RH: ABO/RH(D): O POS

## 2023-04-06 LAB — HCG, QUANTITATIVE, PREGNANCY: hCG, Beta Chain, Quant, S: 125266 m[IU]/mL — ABNORMAL HIGH (ref <5)

## 2023-04-06 NOTE — ED Triage Notes (Addendum)
 Pt reports she is [redacted] weeks pregnant, seen at [facility], and tonight developed a small amount of vaginal bleeding. Pt reports normal pregnancy so far. Pt is G2P0. Pt denies abd pain or cramping or dysuria. Pt also reports some vaginal irritation and itchiness. Pt reports with her last pregnancy she had a still born at 43 weeks, this was back in August 2024. Pt also states with her last pregnancy she had preeclampsia.

## 2023-04-06 NOTE — ED Provider Triage Note (Signed)
 Emergency Medicine Provider Triage Evaluation Note  Savannah Hoffman , a 28 y.o. female  was evaluated in triage.  Pt complains of vaginal bleeding in pregnancy. Patient had light bleeding when wiping after the restroom this evening. No abd pain. No urinary changes. G2P0.  Review of Systems  Positive: Vaginal bleeding in pregnancy Negative: Abd pain, urinary changes  Physical Exam  Ht 5' (1.524 m)   Wt 81.6 kg   LMP 01/01/2022 (Exact Date) Comment: pt just gave birth  BMI 35.15 kg/m  Gen:   Awake, no distress   Resp:  Normal effort  MSK:   Moves extremities without difficulty  Other:    Medical Decision Making  Medically screening exam initiated at 9:06 PM.  Appropriate orders placed.  Savannah Hoffman was informed that the remainder of the evaluation will be completed by another provider, this initial triage assessment does not replace that evaluation, and the importance of remaining in the ED until their evaluation is complete.  Labs, ua, preg test, hcg, [place] ordered   Savannah Hoffman 04/06/23 2106

## 2023-04-07 ENCOUNTER — Other Ambulatory Visit: Payer: Self-pay

## 2023-04-07 ENCOUNTER — Telehealth: Payer: Self-pay | Admitting: Emergency Medicine

## 2023-04-07 ENCOUNTER — Emergency Department
Admission: EM | Admit: 2023-04-07 | Discharge: 2023-04-07 | Discharge status: Against Medical Advice | Attending: Emergency Medicine | Admitting: Emergency Medicine

## 2023-04-07 MED ORDER — NITROFURANTOIN MONOHYD MACRO 100 MG PO CAPS
100.0000 mg | ORAL_CAPSULE | Freq: Two times a day (BID) | ORAL | 0 refills | Status: DC
  Filled 2023-04-07: qty 14, 7d supply, fill #0

## 2023-04-07 MED ORDER — OMRON 3 SERIES BP MONITOR DEVI
0 refills | Status: DC
  Filled 2023-04-07: qty 1, 1d supply, fill #0

## 2023-04-07 MED ORDER — MICONAZOLE NITRATE 2 % VA CREA
TOPICAL_CREAM | VAGINAL | 0 refills | Status: DC
Start: 2023-04-07 — End: 2023-07-21

## 2023-04-07 MED ORDER — FLUCONAZOLE 150 MG PO TABS
150.0000 mg | ORAL_TABLET | Freq: Once | ORAL | 0 refills | Status: AC
  Filled 2023-04-07: qty 1, 1d supply, fill #0

## 2023-04-07 NOTE — ED Notes (Signed)
 No answer when called several times from lobby

## 2023-04-07 NOTE — Telephone Encounter (Signed)
 Called patient due to left emergency department before provider exam to inquire about condition and follow up plans. She says she has appt today with OB to follow up.

## 2023-04-15 ENCOUNTER — Other Ambulatory Visit: Payer: Self-pay

## 2023-04-15 MED ORDER — FLUCONAZOLE 150 MG PO TABS
150.0000 mg | ORAL_TABLET | Freq: Once | ORAL | 0 refills | Status: AC
  Filled 2023-04-15: qty 2, 7d supply, fill #0

## 2023-04-17 ENCOUNTER — Other Ambulatory Visit: Payer: Self-pay

## 2023-04-17 MED ORDER — ONDANSETRON HCL 4 MG PO TABS
4.0000 mg | ORAL_TABLET | Freq: Three times a day (TID) | ORAL | 0 refills | Status: DC | PRN
  Filled 2023-04-17: qty 20, 7d supply, fill #0

## 2023-04-22 ENCOUNTER — Other Ambulatory Visit: Payer: Self-pay

## 2023-04-22 MED ORDER — FLUCONAZOLE 150 MG PO TABS
150.0000 mg | ORAL_TABLET | Freq: Every day | ORAL | 0 refills | Status: DC
  Filled 2023-04-22: qty 2, 7d supply, fill #0

## 2023-05-07 ENCOUNTER — Other Ambulatory Visit: Payer: Self-pay

## 2023-05-08 ENCOUNTER — Other Ambulatory Visit: Payer: Self-pay

## 2023-05-08 MED ORDER — TERCONAZOLE 0.8 % VA CREA
1.0000 | TOPICAL_CREAM | Freq: Every day | VAGINAL | 1 refills | Status: DC
  Filled 2023-05-08 – 2023-05-19 (×2): qty 20, 3d supply, fill #0

## 2023-05-19 ENCOUNTER — Other Ambulatory Visit: Payer: Self-pay

## 2023-05-22 ENCOUNTER — Other Ambulatory Visit: Payer: Self-pay

## 2023-05-22 MED ORDER — METRONIDAZOLE 500 MG PO TABS
500.0000 mg | ORAL_TABLET | Freq: Two times a day (BID) | ORAL | 0 refills | Status: DC
  Filled 2023-05-22: qty 14, 7d supply, fill #0

## 2023-05-22 MED ORDER — FLUCONAZOLE 150 MG PO TABS
150.0000 mg | ORAL_TABLET | ORAL | 0 refills | Status: DC
  Filled 2023-05-22: qty 4, 28d supply, fill #0

## 2023-06-19 ENCOUNTER — Other Ambulatory Visit: Payer: Self-pay

## 2023-06-19 MED ORDER — FLUCONAZOLE 150 MG PO TABS
150.0000 mg | ORAL_TABLET | Freq: Once | ORAL | 0 refills | Status: AC
  Filled 2023-06-19: qty 1, 1d supply, fill #0

## 2023-06-19 MED ORDER — METRONIDAZOLE 500 MG PO TABS
500.0000 mg | ORAL_TABLET | Freq: Two times a day (BID) | ORAL | 0 refills | Status: DC
Start: 2023-06-19 — End: 2023-07-21
  Filled 2023-06-19: qty 14, 7d supply, fill #0

## 2023-06-19 MED ORDER — TERCONAZOLE 0.4 % VA CREA
TOPICAL_CREAM | Freq: Every evening | VAGINAL | 0 refills | Status: DC
  Filled 2023-06-19: qty 45, 7d supply, fill #0

## 2023-06-23 ENCOUNTER — Other Ambulatory Visit: Payer: Self-pay

## 2023-06-23 MED ORDER — AZITHROMYCIN 500 MG PO TABS
1000.0000 mg | ORAL_TABLET | Freq: Every day | ORAL | 0 refills | Status: AC
Start: 2023-06-23 — End: 2023-06-27
  Filled 2023-06-23: qty 2, 1d supply, fill #0

## 2023-06-26 ENCOUNTER — Other Ambulatory Visit: Payer: Self-pay

## 2023-07-17 ENCOUNTER — Other Ambulatory Visit: Payer: Self-pay

## 2023-07-17 DIAGNOSIS — O99891 Other specified diseases and conditions complicating pregnancy: Secondary | ICD-10-CM

## 2023-07-17 DIAGNOSIS — O09293 Supervision of pregnancy with other poor reproductive or obstetric history, third trimester: Secondary | ICD-10-CM | POA: Insufficient documentation

## 2023-07-17 DIAGNOSIS — O34219 Maternal care for unspecified type scar from previous cesarean delivery: Secondary | ICD-10-CM | POA: Diagnosis present

## 2023-07-17 DIAGNOSIS — Z8759 Personal history of other complications of pregnancy, childbirth and the puerperium: Secondary | ICD-10-CM

## 2023-07-17 LAB — OB RESULTS CONSOLE HEPATITIS B SURFACE ANTIGEN: Hepatitis B Surface Ag: NEGATIVE

## 2023-07-17 LAB — OB RESULTS CONSOLE HIV ANTIBODY (ROUTINE TESTING): HIV: NONREACTIVE

## 2023-07-17 LAB — OB RESULTS CONSOLE RPR: RPR: NONREACTIVE

## 2023-07-17 MED ORDER — TERCONAZOLE 0.4 % VA CREA
1.0000 | TOPICAL_CREAM | Freq: Every day | VAGINAL | 0 refills | Status: DC
  Filled 2023-07-17: qty 45, 7d supply, fill #0

## 2023-07-20 DIAGNOSIS — O99013 Anemia complicating pregnancy, third trimester: Secondary | ICD-10-CM | POA: Diagnosis present

## 2023-07-21 ENCOUNTER — Other Ambulatory Visit: Payer: Self-pay

## 2023-07-21 ENCOUNTER — Encounter: Payer: Self-pay | Admitting: Obstetrics and Gynecology

## 2023-07-21 ENCOUNTER — Observation Stay
Admission: EM | Admit: 2023-07-21 | Discharge: 2023-07-21 | Disposition: A | Discharge status: Home / Self Care | Attending: Obstetrics and Gynecology | Admitting: Obstetrics and Gynecology

## 2023-07-21 DIAGNOSIS — O26852 Spotting complicating pregnancy, second trimester: Secondary | ICD-10-CM | POA: Diagnosis not present

## 2023-07-21 DIAGNOSIS — Z3A27 27 weeks gestation of pregnancy: Secondary | ICD-10-CM | POA: Insufficient documentation

## 2023-07-21 DIAGNOSIS — O368190 Decreased fetal movements, unspecified trimester, not applicable or unspecified: Principal | ICD-10-CM | POA: Diagnosis present

## 2023-07-21 DIAGNOSIS — O368120 Decreased fetal movements, second trimester, not applicable or unspecified: Secondary | ICD-10-CM | POA: Diagnosis present

## 2023-07-21 NOTE — OB Triage Note (Signed)
 Pt is a G2P0 at 101w5d pt states decreased fetal movement today. Pt had intercourse Monday am and had some spotting and blood when wiping. She has not needed to wear a pad but did again have some bleeding with wiping today. Pt has a significant OB history with a 35 w IUFD and HELLP. FHR 150 movement heard but pt still does not feel movement

## 2023-07-21 NOTE — Discharge Summary (Signed)
 Patient ID: Savannah Hoffman MRN: [id number] DOB/AGE: Nov 30, 1995 28 y.o.  Admit date: 07/21/2023 Discharge date: 07/21/2023  Admission Diagnoses: Decreased fetal movement and spotting after IC  Discharge Diagnoses: RNST  Factors complicating pregnancy: Hx of IUFD at 34 wks d/t placental abruption Hx of pre-eclampsia w/ severe features Obesity BMI: 33.29 NOB Mild anemia  Hx of PPH Hx of PP depression Previous C/S x 1 C/s done for: IUFD at 34 wks  Prenatal Procedures: NST  Consults: None  Significant Diagnostic Studies:  No results found for this or any previous visit (from the past week).  Treatments: none  Hospital Course:  This is a 28 y.o. G2P0100 with IUP at [redacted]w[redacted]d seen for decreased fetal movement.  No leaking of fluid.  She was observed, fetal heart rate monitoring remained reassuring, and she had no signs/symptoms of preterm labor or other maternal-fetal concerns.  She was deemed stable for discharge to home with outpatient follow up.  Discharge Physical Exam:  BP 127/73   Pulse 94   Temp 98.2 F (36.8 C) (Oral)   Resp 18   Ht 5' (1.524 m)   Wt 88.9 kg   LMP 01/01/2022 (Exact Date) Comment: pt just gave birth  BMI 38.28 kg/m   General: NAD CV: RRR Pulm: nl effort ABD: s/nd/nt, gravid DVT Evaluation: LE non-ttp, no evidence of DVT on exam.  NST: FHR baseline: 145 bpm Variability: moderate Accelerations: yes Decelerations: none Time: 50 minutes Category/reactivity: reactive  TOCO: quiet SVE: deferred      Discharge Condition: Stable  Disposition: Discharge disposition: 01-Home or Self Care        Allergies as of 07/21/2023   No Known Allergies      Medication List     STOP taking these medications    furosemide  20 MG tablet Commonly known as: LASIX    gabapentin  300 MG capsule Commonly known as: NEURONTIN    ibuprofen  600 MG tablet Commonly known as: ADVIL    labetalol  100 MG tablet Commonly known as: NORMODYNE     metroNIDAZOLE  500 MG tablet Commonly known as: FLAGYL    miconazole  2 % vaginal cream Commonly known as: MONISTAT  7   nitrofurantoin  (macrocrystal-monohydrate) 100 MG capsule Commonly known as: MACROBID    oseltamivir  75 MG capsule Commonly known as: TAMIFLU        TAKE these medications    butalbital -acetaminophen -caffeine  50-325-40 MG tablet Commonly known as: FIORICET  Take 1 tablet by mouth every 4 (four) hours as needed.   docusate sodium  100 MG capsule Commonly known as: COLACE Take 1 capsule (100 mg total) by mouth 2 (two) times daily.   escitalopram  10 MG tablet Commonly known as: LEXAPRO  Take 1 tablet (10 mg total) by mouth once daily   ondansetron  4 MG tablet Commonly known as: ZOFRAN  Take 1 tablet (4 mg total) by mouth every 8 (eight) hours as needed for Nausea for up to 7 days   phentermine  37.5 MG tablet Commonly known as: ADIPEX-P  Take 1 tablet (37.5 mg total) by mouth every morning before breakfast   Prenatal 27-1 MG Tabs Take 1 tablet by mouth daily.   simethicone  80 MG chewable tablet Commonly known as: MYLICON Chew 1 tablet (80 mg total) by mouth 3 (three) times daily after meals.   terconazole  0.4 % vaginal cream Commonly known as: TERAZOL 7  Place 1 applicator vaginally at bedtime. What changed: Another medication with the same name was removed. Continue taking this medication, and follow the directions you see here.   Vitamin D  50 MCG (2000 UT) tablet  Take 2.5 tablets (5,000 Units total) by mouth daily.   Zinc  Sulfate 220 (50 Zn) MG Tabs Take 1 tablet (220 mg total) by mouth daily.        Follow-up Information     [facility] Follow up.   Why: Keep all scheduled appointments Contact information: [address] [place]  [address] [phone]                Signed:  DELON MYRON HOWARD 07/21/2023 9:27 PM

## 2023-07-21 NOTE — OB Triage Note (Signed)
 Pt states she has had a recurrent yeast infection with this pregnancy and has tried multiple medications. She took a diflucan  yesterday.

## 2023-07-22 ENCOUNTER — Other Ambulatory Visit: Payer: Self-pay

## 2023-07-22 MED ORDER — METRONIDAZOLE 500 MG PO TABS
500.0000 mg | ORAL_TABLET | Freq: Two times a day (BID) | ORAL | 0 refills | Status: DC
  Filled 2023-07-22: qty 14, 7d supply, fill #0

## 2023-07-22 MED ORDER — TERCONAZOLE 0.4 % VA CREA
1.0000 | TOPICAL_CREAM | Freq: Every day | VAGINAL | 0 refills | Status: DC
  Filled 2023-07-22: qty 45, 7d supply, fill #0

## 2023-07-23 ENCOUNTER — Other Ambulatory Visit: Payer: Self-pay

## 2023-08-11 ENCOUNTER — Other Ambulatory Visit: Payer: Self-pay

## 2023-08-11 MED ORDER — AZITHROMYCIN 250 MG PO TABS
ORAL_TABLET | ORAL | 0 refills | Status: AC
  Filled 2023-08-11: qty 6, 5d supply, fill #0

## 2023-08-11 MED ORDER — FLUTICASONE PROPIONATE 50 MCG/ACT NA SUSP
2.0000 | Freq: Every day | NASAL | 1 refills | Status: DC
  Filled 2023-08-11: qty 16, 30d supply, fill #0

## 2023-08-11 MED ORDER — PAXLOVID (300/100) 20 X 150 MG & 10 X 100MG PO TBPK
ORAL_TABLET | ORAL | 0 refills | Status: DC
  Filled 2023-08-11: qty 30, 5d supply, fill #0

## 2023-08-21 ENCOUNTER — Other Ambulatory Visit: Payer: Self-pay

## 2023-08-21 MED ORDER — METRONIDAZOLE 500 MG PO TABS
500.0000 mg | ORAL_TABLET | Freq: Two times a day (BID) | ORAL | 0 refills | Status: DC
  Filled 2023-08-21: qty 14, 7d supply, fill #0

## 2023-08-27 ENCOUNTER — Other Ambulatory Visit: Payer: Self-pay

## 2023-08-27 MED ORDER — ONDANSETRON HCL 4 MG PO TABS
4.0000 mg | ORAL_TABLET | Freq: Three times a day (TID) | ORAL | 0 refills | Status: AC | PRN
  Filled 2023-08-27: qty 20, 7d supply, fill #0

## 2023-08-28 ENCOUNTER — Other Ambulatory Visit: Payer: Self-pay

## 2023-08-28 MED ORDER — FLUCONAZOLE 150 MG PO TABS
150.0000 mg | ORAL_TABLET | Freq: Once | ORAL | 1 refills | Status: AC
  Filled 2023-08-28: qty 2, 2d supply, fill #0

## 2023-09-01 ENCOUNTER — Other Ambulatory Visit: Payer: Self-pay

## 2023-09-01 MED ORDER — PANTOPRAZOLE SODIUM 20 MG PO TBEC
20.0000 mg | DELAYED_RELEASE_TABLET | Freq: Every day | ORAL | 1 refills | Status: DC
  Filled 2023-09-01: qty 30, 30d supply, fill #0

## 2023-09-01 MED ORDER — PRENATAL 28-0.8 MG PO TABS
1.0000 | ORAL_TABLET | Freq: Every day | ORAL | 1 refills | Status: DC
  Filled 2023-09-01: qty 30, 30d supply, fill #0

## 2023-09-08 ENCOUNTER — Other Ambulatory Visit: Payer: Self-pay

## 2023-09-08 MED ORDER — FLUCONAZOLE 150 MG PO TABS
150.0000 mg | ORAL_TABLET | Freq: Every day | ORAL | 0 refills | Status: DC
  Filled 2023-09-08: qty 1, 1d supply, fill #0

## 2023-09-17 IMAGING — DX DG KNEE 1-2V PORT*L*
1 series · 2 of 2 positions shown · non-contrast
Comparison: 03/26/2021

CLINICAL DATA: Fracture fixation

EXAM:
PORTABLE LEFT KNEE - 1-2 VIEW

[Series 1: knee · 0.14mm/px · 2 of 2 slices shown]
[im 1/2]
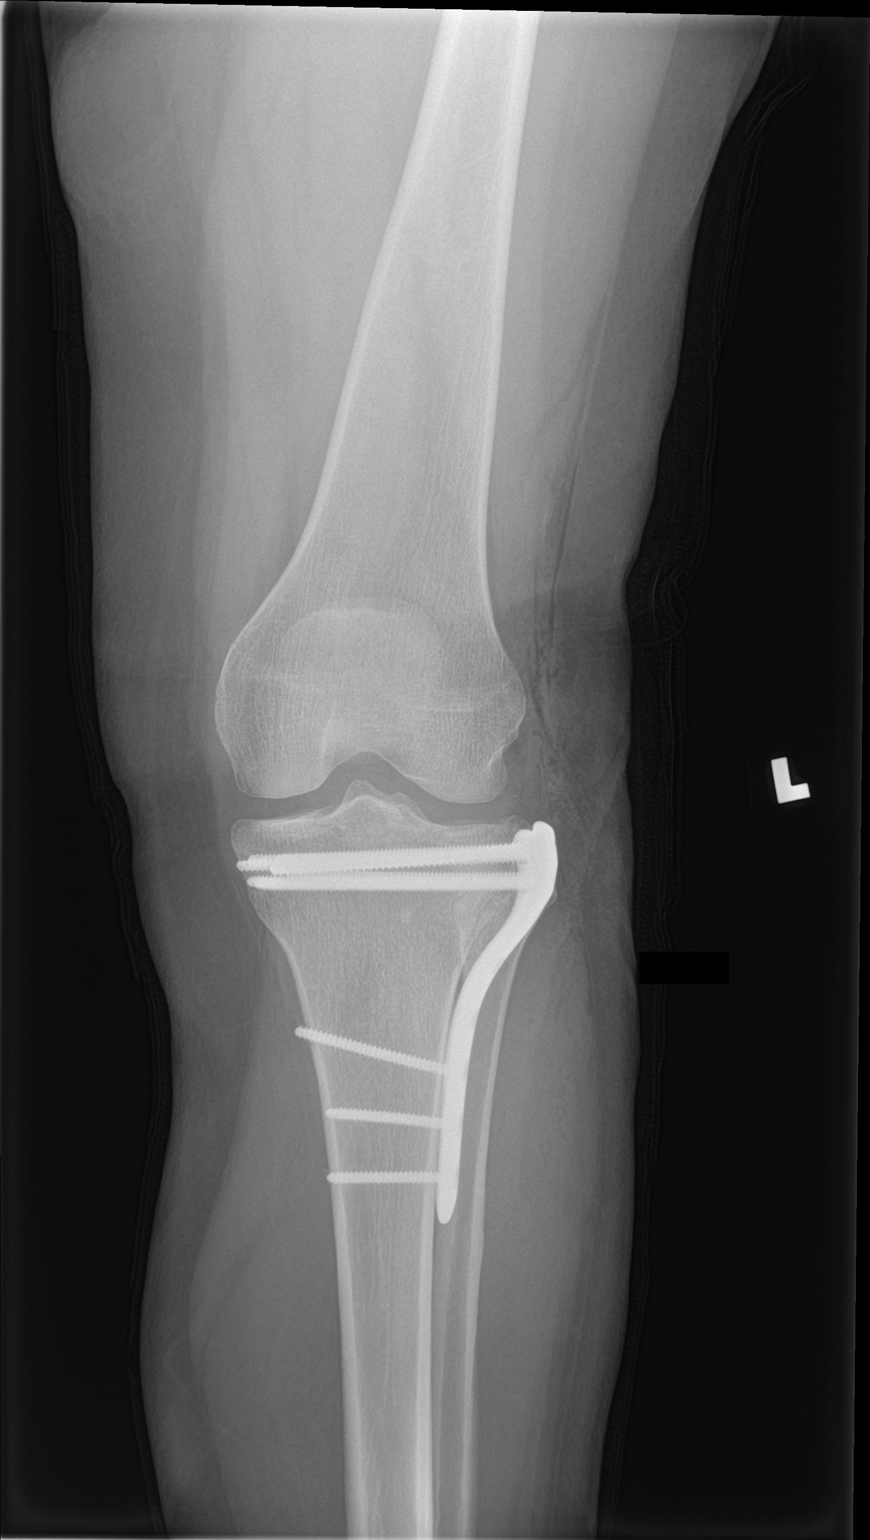
[im 2/2]
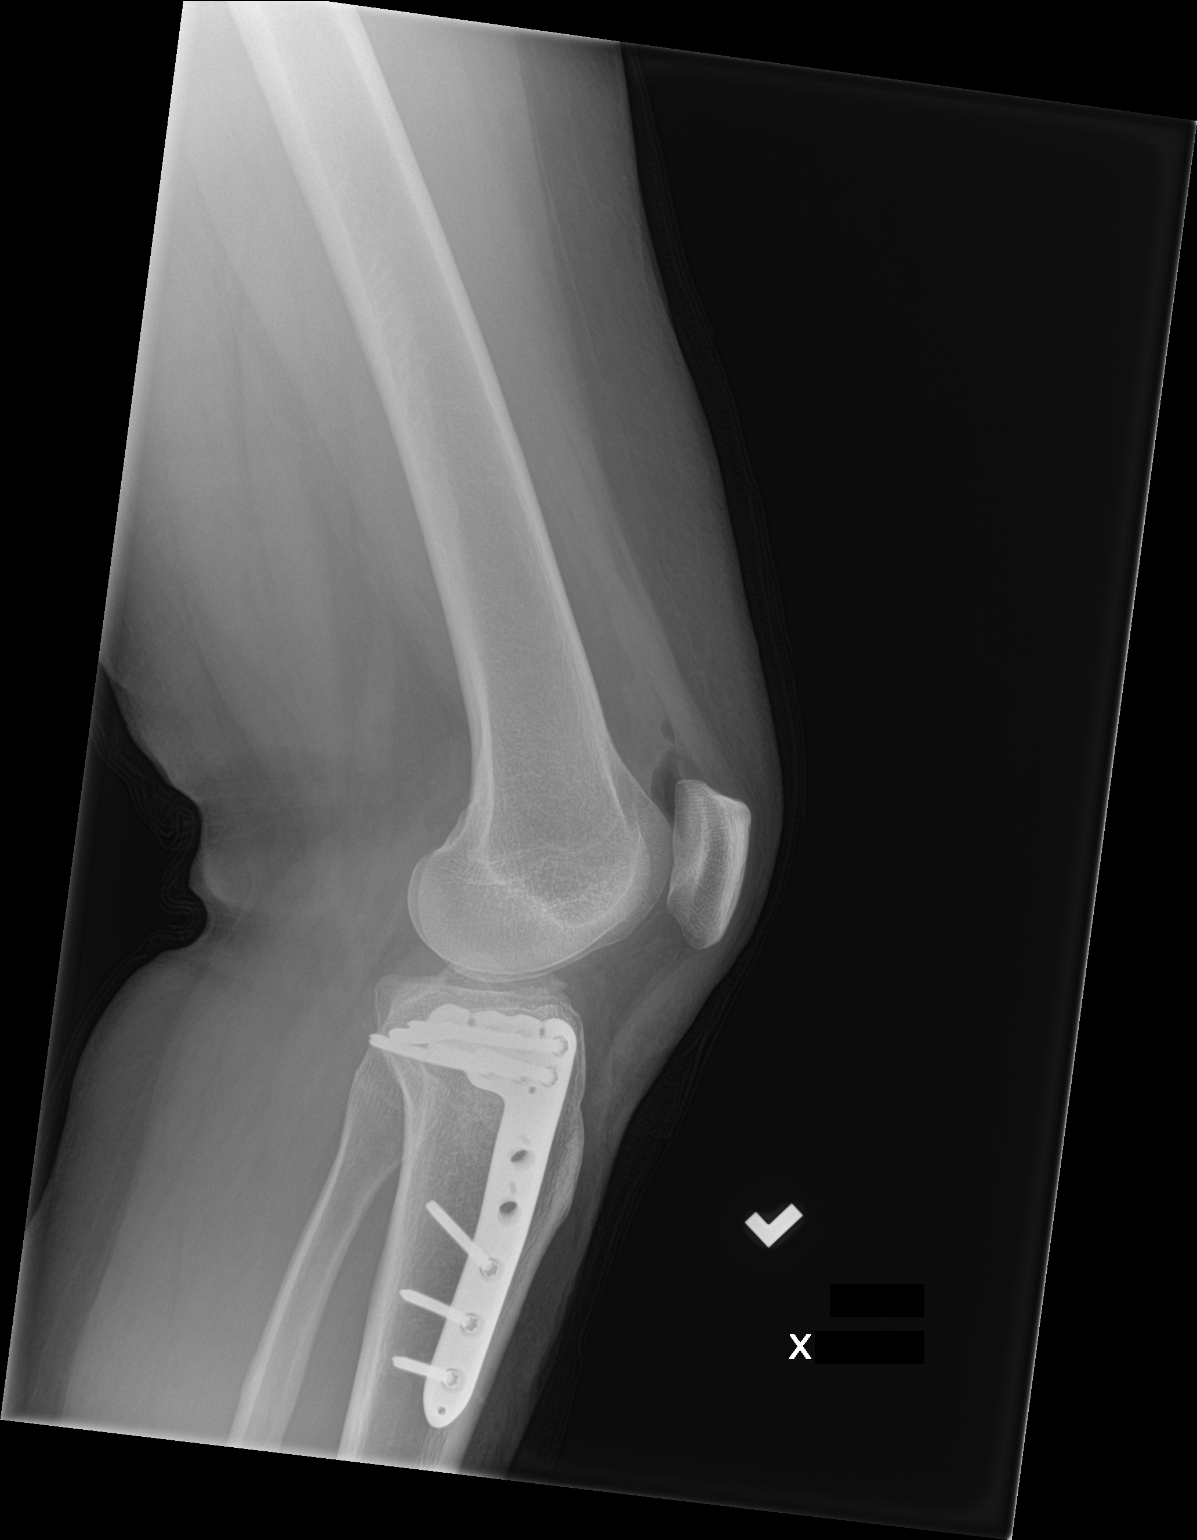

[2 of 2 positions shown; findings below may reference images not displayed]

FINDINGS: Postop placement of lateral plate and screws in the proximal tibia.
Interval elevation of fracture of the lateral tibial plateau.

Gas and fluid in the knee joint.
IMPRESSION: ORIF proximal tibia for lateral tibial plateau fracture.

## 2023-09-18 ENCOUNTER — Encounter: Payer: Self-pay | Admitting: Obstetrics and Gynecology

## 2023-09-18 ENCOUNTER — Observation Stay: Admission: EM | Admit: 2023-09-18 | Discharge: 2023-09-19 | Disposition: A | Discharge status: Home / Self Care

## 2023-09-18 ENCOUNTER — Other Ambulatory Visit: Payer: Self-pay

## 2023-09-18 DIAGNOSIS — Z7982 Long term (current) use of aspirin: Secondary | ICD-10-CM | POA: Diagnosis not present

## 2023-09-18 DIAGNOSIS — N760 Acute vaginitis: Secondary | ICD-10-CM | POA: Diagnosis present

## 2023-09-18 DIAGNOSIS — O4703 False labor before 37 completed weeks of gestation, third trimester: Secondary | ICD-10-CM | POA: Diagnosis not present

## 2023-09-18 DIAGNOSIS — Z3A36 36 weeks gestation of pregnancy: Secondary | ICD-10-CM | POA: Insufficient documentation

## 2023-09-18 DIAGNOSIS — R109 Unspecified abdominal pain: Secondary | ICD-10-CM | POA: Diagnosis present

## 2023-09-18 DIAGNOSIS — Z8759 Personal history of other complications of pregnancy, childbirth and the puerperium: Secondary | ICD-10-CM | POA: Insufficient documentation

## 2023-09-18 DIAGNOSIS — O0993 Supervision of high risk pregnancy, unspecified, third trimester: Secondary | ICD-10-CM

## 2023-09-18 DIAGNOSIS — O479 False labor, unspecified: Principal | ICD-10-CM | POA: Diagnosis present

## 2023-09-18 DIAGNOSIS — O26899 Other specified pregnancy related conditions, unspecified trimester: Secondary | ICD-10-CM | POA: Diagnosis present

## 2023-09-18 LAB — CBC
HCT: 31.9 % — ABNORMAL LOW (ref 36.0–46.0)
Hemoglobin: 10.5 g/dL — ABNORMAL LOW (ref 12.0–15.0)
MCH: 27.3 pg (ref 26.0–34.0)
MCHC: 32.9 g/dL (ref 30.0–36.0)
MCV: 82.9 fL (ref 80.0–100.0)
Platelets: 189 K/uL (ref 150–400)
RBC: 3.85 MIL/uL — ABNORMAL LOW (ref 3.87–5.11)
RDW: 14.6 % (ref 11.5–15.5)
WBC: 13.3 K/uL — ABNORMAL HIGH (ref 4.0–10.5)
nRBC: 0 % (ref 0.0–0.2)

## 2023-09-18 LAB — URINALYSIS, COMPLETE (UACMP) WITH MICROSCOPIC
Bilirubin Urine: NEGATIVE
Glucose, UA: NEGATIVE mg/dL
Hgb urine dipstick: NEGATIVE
Ketones, ur: NEGATIVE mg/dL
Nitrite: NEGATIVE
Protein, ur: 30 mg/dL — AB
Specific Gravity, Urine: 1.027 (ref 1.005–1.030)
pH: 6 (ref 5.0–8.0)

## 2023-09-18 LAB — COMPREHENSIVE METABOLIC PANEL WITH GFR
ALT: 14 U/L (ref 0–44)
AST: 37 U/L (ref 15–41)
Albumin: 3 g/dL — ABNORMAL LOW (ref 3.5–5.0)
Alkaline Phosphatase: 122 U/L (ref 38–126)
Anion gap: 8 (ref 5–15)
BUN: 10 mg/dL (ref 6–20)
CO2: 20 mmol/L — ABNORMAL LOW (ref 22–32)
Calcium: 8.7 mg/dL — ABNORMAL LOW (ref 8.9–10.3)
Chloride: 108 mmol/L (ref 98–111)
Creatinine, Ser: 0.46 mg/dL (ref 0.44–1.00)
GFR, Estimated: >60 mL/min (ref >60)
Glucose, Bld: 95 mg/dL (ref 70–99)
Potassium: 3.7 mmol/L (ref 3.5–5.1)
Sodium: 136 mmol/L (ref 135–145)
Total Bilirubin: 0.3 mg/dL (ref 0.0–1.2)
Total Protein: 6.5 g/dL (ref 6.5–8.1)

## 2023-09-18 LAB — WET PREP, GENITAL
Sperm: NONE SEEN
Trich, Wet Prep: NONE SEEN
WBC, Wet Prep HPF POC: >=10 — AB (ref <10)
Yeast Wet Prep HPF POC: NONE SEEN

## 2023-09-18 LAB — PROTEIN / CREATININE RATIO, URINE
Creatinine, Urine: 166 mg/dL
Protein Creatinine Ratio: 0.1 mg/mg{creat} (ref 0.00–0.15)
Total Protein, Urine: 16 mg/dL

## 2023-09-18 MED ORDER — HYDRALAZINE HCL 20 MG/ML IJ SOLN
10.0000 mg | INTRAMUSCULAR | Status: DC | PRN
Start: 2023-09-18 — End: 2023-09-19

## 2023-09-18 MED ORDER — LABETALOL HCL 5 MG/ML IV SOLN: 80.0000 mg | INTRAVENOUS | Status: DC | PRN

## 2023-09-18 MED ORDER — HYDROXYZINE HCL 25 MG PO TABS
50.0000 mg | ORAL_TABLET | Freq: Once | ORAL | Status: AC
  Administered 2023-09-18: 50 mg via ORAL
  Filled 2023-09-18: qty 2

## 2023-09-18 MED ORDER — METRONIDAZOLE 500 MG/100ML IV SOLN
500.0000 mg | Freq: Two times a day (BID) | INTRAVENOUS | Status: DC
  Administered 2023-09-18: 500 mg via INTRAVENOUS
  Filled 2023-09-18: qty 100

## 2023-09-18 MED ORDER — LACTATED RINGERS IV SOLN: INTRAVENOUS | Status: DC

## 2023-09-18 MED ORDER — ACETAMINOPHEN 500 MG PO TABS
1000.0000 mg | ORAL_TABLET | Freq: Four times a day (QID) | ORAL | Status: DC | PRN
  Administered 2023-09-19: 1000 mg via ORAL
  Filled 2023-09-18: qty 2

## 2023-09-18 MED ORDER — CALCIUM CARBONATE ANTACID 500 MG PO CHEW: 2.0000 | CHEWABLE_TABLET | ORAL | Status: DC | PRN

## 2023-09-18 MED ORDER — LABETALOL HCL 5 MG/ML IV SOLN
20.0000 mg | INTRAVENOUS | Status: DC | PRN
Start: 2023-09-18 — End: 2023-09-19

## 2023-09-18 MED ORDER — LABETALOL HCL 5 MG/ML IV SOLN: 40.0000 mg | INTRAVENOUS | Status: DC | PRN

## 2023-09-18 NOTE — H&P (Signed)
 TRIAGE VISIT with NST   Savannah Hoffman is a 28 y.o. G2P0100. She is at [redacted]w[redacted]d gestation, with a hx of IUFD and placental abruption at 34wks with PreE with severe features/HELLP  Indication: abdominal pain  S: CTX, no VB. Active fetal movement. Denies headache, SOB, new onset swelling, RUQ pain, visual changes.  O:  BP (!) 147/87   Pulse (!) 107   Temp 98.3 F (36.8 C) (Oral)   Resp 16   Ht 5' 1 (1.549 m)   Wt 95.3 kg   LMP 01/01/2022 (Exact Date) Comment: pt just gave birth  BMI 39.68 kg/m  Results for orders placed or performed during the hospital encounter of 09/18/23 (from the past 48 hours)  Urinalysis, Complete w Microscopic -Urine, Clean Catch   Collection Time: 09/18/23  9:50 PM  Result Value Ref Range   Color, Urine YELLOW (A) YELLOW   APPearance HAZY (A) CLEAR   Specific Gravity, Urine 1.027 1.005 - 1.030   pH 6.0 5.0 - 8.0   Glucose, UA NEGATIVE NEGATIVE mg/dL   Hgb urine dipstick NEGATIVE NEGATIVE   Bilirubin Urine NEGATIVE NEGATIVE   Ketones, ur NEGATIVE NEGATIVE mg/dL   Protein, ur 30 (A) NEGATIVE mg/dL   Nitrite NEGATIVE NEGATIVE   Leukocytes,Ua LARGE (A) NEGATIVE   RBC / HPF 0-5 0 - 5 RBC/hpf   WBC, UA 11-20 0 - 5 WBC/hpf   Bacteria, UA RARE (A) NONE SEEN   Squamous Epithelial / HPF 11-20 0 - 5 /HPF   Mucus PRESENT      Gen: NAD, AAOx3      Abd: FNTTP      Ext: Non-tender, trace edema  NST: Baseline: 130 Variability: Moderate Accelerations: 15x15 Decelerations: None  TOCO: quiet SVE: Dilation: 1 Effacement (%): 50 Station: -3 Exam by:: D Burns, RN  NST: Category I strip, see detailed evaluation above  A/P:  28 y.o. G2P0100 [redacted]w[redacted]d with abdominal pain, otherwise normal vitals.                       Given hx and initial elevated BP, will get PreE labs, iv hydration, wet prep Fetal Wellbeing: Reassuring Cat 1 tracing. CEFM

## 2023-09-19 ENCOUNTER — Encounter: Payer: Self-pay | Admitting: Obstetrics and Gynecology

## 2023-09-19 DIAGNOSIS — O4703 False labor before 37 completed weeks of gestation, third trimester: Secondary | ICD-10-CM | POA: Diagnosis not present

## 2023-09-19 DIAGNOSIS — N760 Acute vaginitis: Secondary | ICD-10-CM | POA: Diagnosis present

## 2023-09-19 LAB — TYPE AND SCREEN
ABO/RH(D): O POS
Antibody Screen: NEGATIVE

## 2023-09-19 NOTE — Progress Notes (Signed)
 Pt reports no pain at this time. Pt has been able to sleep through the night. No concerns at this time. Billijo Dilling L. Geofm, RN BSN 09/19/2023 5:12 AM

## 2023-09-19 NOTE — Discharge Summary (Addendum)
 Savannah Hoffman is a 28 y.o. female. She is at [redacted]w[redacted]d gestation. Patient's last menstrual period was 01/01/2022 (exact date). 10/15/2023, Date entered prior to episode creation   Prenatal care site: [facility]  Chief complaint: Abdominal pain  Admission Diagnoses:  1) intrauterine pregnancy at [redacted]w[redacted]d  2) Uterine contractions during pregnancy [O47.9] 3)Hx of placenta abruption with IUFD in prior pregnancy  Discharge Diagnoses:  Principal Problem:   Uterine contractions during pregnancy Active Problems:   Abdominal pain affecting pregnancy   Supervision of high-risk pregnancy, third trimester   Bacterial vaginosis    HPI: [name] presents to L&D with complaints of abdominal pain.  Her pregnancy is complicated by prior abruption and IUFD with severe PreE and HELLP in previous pregnancy.  She denies Contractions. Endorses fetal movement as active.   S: This morning, patient is resting comfortably. no CTX, no VB.no LOF,  Active fetal movement. Her pain has resolved. BV positive, treated  No s/s PReE, her PReE labs are all wnl  Maternal Medical History:  Past Medical Hx:  has a past medical history of COVID (12/2019), Pre-diabetes, and Vitamin D  deficiency (03/27/2021).    Past Surgical Hx:  has a past surgical history that includes Wisdom tooth extraction; ORIF tibia plateau (Left, 03/26/2021); and Cesarean section (09/03/2022).   Allergies  Allergen Reactions   Coconut (Cocos Nucifera) Swelling     Prior to Admission medications   Medication Sig Start Date End Date Taking? Authorizing Provider  aspirin EC 81 MG tablet Take 81 mg by mouth daily. Swallow whole.   Yes [provider]  Prenatal 27-1 MG TABS Take 1 tablet by mouth daily. 03/26/22  Yes   butalbital -acetaminophen -caffeine  (FIORICET ) 50-325-40 MG tablet Take 1 tablet by mouth every 4 (four) hours as needed. Patient not taking: Reported on 09/17/2023 02/23/23     Cholecalciferol  (VITAMIN D ) 50 MCG  (2000 UT) tablet Take 2.5 tablets (5,000 Units total) by mouth daily. Patient not taking: Reported on 09/17/2023 03/27/21   Deward Eck, PA-C  docusate sodium  (COLACE) 100 MG capsule Take 1 capsule (100 mg total) by mouth 2 (two) times daily. Patient not taking: Reported on 09/17/2023 03/27/21   Deward Eck, PA-C  escitalopram  (LEXAPRO ) 10 MG tablet Take 1 tablet (10 mg total) by mouth once daily Patient not taking: Reported on 09/17/2023 07/11/22     fluconazole  (DIFLUCAN ) 150 MG tablet Take 1 tablet (150 mg total) by mouth daily. 09/08/23     fluticasone  (FLONASE ) 50 MCG/ACT nasal spray Place 2 sprays into both nostrils daily. Patient not taking: Reported on 09/17/2023 08/11/23     metroNIDAZOLE  (FLAGYL ) 500 MG tablet Take 1 tablet (500 mg total) by mouth 2 (two) times daily for 7 days Patient not taking: Reported on 09/17/2023 08/21/23     nirmatrelvir /ritonavir  (PAXLOVID , 300/100,) 20 x 150 MG & 10 x 100MG  TBPK Take 2 (two) pink tablets (300 mg nirmatrelvir ) with 1 (one) tablet (100 mg ritonavir ) by mouth twice a day for 5 days. All 3 (three) tablets to be taken together every morning and evening. Patient not taking: Reported on 09/17/2023 08/11/23     ondansetron  (ZOFRAN ) 4 MG tablet Take 1 tablet (4 mg total) by mouth every 8 (eight) hours as needed for Nausea for up to 7 days Patient not taking: Reported on 09/17/2023 04/17/23     pantoprazole  (PROTONIX ) 20 MG tablet Take 1 tablet (20 mg total) by mouth daily. Patient not taking: Reported on 09/17/2023 09/01/23     phentermine  (ADIPEX-P )  37.5 MG tablet Take 1 tablet (37.5 mg total) by mouth every morning before breakfast Patient not taking: Reported on 09/17/2023 10/24/22     Prenatal 28-0.8 MG TABS Take 1 tablet by mouth daily. 09/01/23     simethicone  (MYLICON) 80 MG chewable tablet Chew 1 tablet (80 mg total) by mouth 3 (three) times daily after meals. Patient not taking: Reported on 09/17/2023 09/06/22   Vernel Therisa HERO, CNM  terconazole  (TERAZOL 7 ) 0.4 %  vaginal cream Place 1 applicator vaginally at bedtime. Patient not taking: Reported on 09/17/2023 07/17/23     terconazole  (TERAZOL 7 ) 0.4 % vaginal cream Place 1 applicatorful vaginally at bedtime. Patient not taking: Reported on 09/17/2023 07/22/23     Zinc  Sulfate 220 (50 Zn) MG TABS Take 1 tablet (220 mg total) by mouth daily. Patient not taking: Reported on 09/17/2023 03/27/21   Deward Eck, PA-C  norelgestromin -ethinyl estradiol  (XULANE) 150-35 MCG/24HR transdermal patch Place 1 patch onto the skin once a week. 10/24/22 01/29/23      Social History: She  reports that she has never smoked. She has never used smokeless tobacco. She reports that she does not currently use alcohol. She reports that she does not use drugs.  Family History: family history is not on file.   Review of Systems: A full review of systems was performed and negative except as noted in the HPI.     Pertinent Results:   O:  BP 132/82 (BP Location: Left Arm)   Pulse 91   Temp 98.3 F (36.8 C) (Oral)   Resp 16   Ht 5' 1 (1.549 m)   Wt 95.3 kg   LMP 01/01/2022 (Exact Date) Comment: pt just gave birth  BMI 39.68 kg/m  Results for orders placed or performed during the hospital encounter of 09/18/23 (from the past 48 hours)  Wet prep, genital   Collection Time: 09/18/23  9:50 PM   Specimen: Urine, Clean Catch  Result Value Ref Range   Yeast Wet Prep HPF POC NONE SEEN NONE SEEN   Trich, Wet Prep NONE SEEN NONE SEEN   Clue Cells Wet Prep HPF POC PRESENT (A) NONE SEEN   WBC, Wet Prep HPF POC >=10 (A) <10   Sperm NONE SEEN   Urinalysis, Complete w Microscopic -Urine, Clean Catch   Collection Time: 09/18/23  9:50 PM  Result Value Ref Range   Color, Urine YELLOW (A) YELLOW   APPearance HAZY (A) CLEAR   Specific Gravity, Urine 1.027 1.005 - 1.030   pH 6.0 5.0 - 8.0   Glucose, UA NEGATIVE NEGATIVE mg/dL   Hgb urine dipstick NEGATIVE NEGATIVE   Bilirubin Urine NEGATIVE NEGATIVE   Ketones, ur NEGATIVE NEGATIVE mg/dL    Protein, ur 30 (A) NEGATIVE mg/dL   Nitrite NEGATIVE NEGATIVE   Leukocytes,Ua LARGE (A) NEGATIVE   RBC / HPF 0-5 0 - 5 RBC/hpf   WBC, UA 11-20 0 - 5 WBC/hpf   Bacteria, UA RARE (A) NONE SEEN   Squamous Epithelial / HPF 11-20 0 - 5 /HPF   Mucus PRESENT   Protein / creatinine ratio, urine   Collection Time: 09/18/23  9:50 PM  Result Value Ref Range   Creatinine, Urine 166 mg/dL   Total Protein, Urine 16 mg/dL   Protein Creatinine Ratio 0.10 0.00 - 0.15 mg/mg[Cre]  Comprehensive metabolic panel   Collection Time: 09/18/23 10:32 PM  Result Value Ref Range   Sodium 136 135 - 145 mmol/L   Potassium 3.7 3.5 - 5.1 mmol/L  Chloride 108 98 - 111 mmol/L   CO2 20 (L) 22 - 32 mmol/L   Glucose, Bld 95 70 - 99 mg/dL   BUN 10 6 - 20 mg/dL   Creatinine, Ser 9.53 0.44 - 1.00 mg/dL   Calcium  8.7 (L) 8.9 - 10.3 mg/dL   Total Protein 6.5 6.5 - 8.1 g/dL   Albumin 3.0 (L) 3.5 - 5.0 g/dL   AST 37 15 - 41 U/L   ALT 14 0 - 44 U/L   Alkaline Phosphatase 122 38 - 126 U/L   Total Bilirubin 0.3 0.0 - 1.2 mg/dL   GFR, Estimated >39 >39 mL/min   Anion gap 8 5 - 15  CBC   Collection Time: 09/18/23 10:32 PM  Result Value Ref Range   WBC 13.3 (H) 4.0 - 10.5 K/uL   RBC 3.85 (L) 3.87 - 5.11 MIL/uL   Hemoglobin 10.5 (L) 12.0 - 15.0 g/dL   HCT 68.0 (L) 63.9 - 53.9 %   MCV 82.9 80.0 - 100.0 fL   MCH 27.3 26.0 - 34.0 pg   MCHC 32.9 30.0 - 36.0 g/dL   RDW 85.3 88.4 - 84.4 %   Platelets 189 150 - 400 K/uL   nRBC 0.0 0.0 - 0.2 %  Type and screen [facility]   Collection Time: 09/18/23 10:34 PM  Result Value Ref Range   ABO/RH(D) O POS    Antibody Screen NEG    Sample Expiration      09/21/2023,2359 Performed at [facility], [address] [place], [place] [address]     No results found.  Constitutional: NAD, AAOx3  PULM: nl respiratory effort Abd: gravid, non-tender Ext: Non-tender, Nonedmeatous Psych: mood appropriate, speech normal Pelvic : deferred SVE:  Dilation: 1 Effacement (%): 50 Station: -3 Exam by:: D Burns, RN   NST: Baseline FHR: 140 beats/min Variability: moderate Accelerations: present Decelerations: absent Tocometry: quiet Time: continuous overnight monitoring  Interpretation: Category I INDICATIONS: history of previous fetal demise and rule out uterine contractions RESULTS:  A NST procedure was performed with FHR monitoring and a normal baseline established, appropriate time of 20-40 minutes of evaluation, and accels >2 seen w 15x15 characteristics.  Results show a REACTIVE NST.   Consults: None  Procedures: none  Hospital Course: The patient was admitted to Labor and Delivery Triage for observation. Her abdominal pain and contractions have resolved with iv fluids, flagyl  x1 and rest.  She was deemed stable for discharge and further outpatient management.   Discharge Condition: good  Disposition:  Discharge disposition: 01-Home or Self Care       Allergies as of 09/19/2023       Reactions   Coconut (cocos Nucifera) Swelling        Medication List     STOP taking these medications    butalbital -acetaminophen -caffeine  50-325-40 MG tablet Commonly known as: FIORICET    docusate sodium  100 MG capsule Commonly known as: COLACE   escitalopram  10 MG tablet Commonly known as: LEXAPRO    fluticasone  50 MCG/ACT nasal spray Commonly known as: FLONASE    metroNIDAZOLE  500 MG tablet Commonly known as: FLAGYL    ondansetron  4 MG tablet Commonly known as: ZOFRAN    pantoprazole  20 MG tablet Commonly known as: PROTONIX    Paxlovid  (300/100) 20 x 150 MG & 10 x 100MG  Tbpk Generic drug: nirmatrelvir /ritonavir    phentermine  37.5 MG tablet Commonly known as: ADIPEX-P    simethicone  80 MG chewable tablet Commonly known as: MYLICON   terconazole  0.4 % vaginal cream Commonly  known as: TERAZOL 7    Vitamin D  50 MCG (2000 UT) tablet   Zinc  Sulfate 220 (50 Zn) MG Tabs       TAKE these medications     aspirin EC 81 MG tablet Take 81 mg by mouth daily. Swallow whole.   fluconazole  150 MG tablet Commonly known as: DIFLUCAN  Take 1 tablet (150 mg total) by mouth daily.   Prenatal 27-1 MG Tabs Take 1 tablet by mouth daily.   Classic Prenatal 28-0.8 MG Tabs Take 1 tablet by mouth daily.        ----- Heather Penton, MD   [facility]

## 2023-09-20 LAB — URINE CULTURE

## 2023-09-21 ENCOUNTER — Other Ambulatory Visit: Payer: Self-pay

## 2023-09-21 MED ORDER — FLUCONAZOLE 150 MG PO TABS
150.0000 mg | ORAL_TABLET | ORAL | 0 refills | Status: DC
  Filled 2023-09-21: qty 2, 3d supply, fill #0

## 2023-09-23 ENCOUNTER — Other Ambulatory Visit: Payer: Self-pay

## 2023-09-23 ENCOUNTER — Encounter
Admission: RE | Admit: 2023-09-23 | Discharge: 2023-09-23 | Disposition: A | Discharge status: Home / Self Care | Source: Ambulatory Visit | Attending: Obstetrics and Gynecology | Admitting: Obstetrics and Gynecology

## 2023-09-23 LAB — OB RESULTS CONSOLE GBS: GBS: POSITIVE

## 2023-09-23 LAB — OB RESULTS CONSOLE GC/CHLAMYDIA
Chlamydia: NEGATIVE
Neisseria Gonorrhea: NEGATIVE

## 2023-09-23 NOTE — H&P (Signed)
 Obstetric Preoperative History and Physical  Savannah Hoffman is a 28 y.o. G2P0100 with IUP at [redacted]w[redacted]d  presenting for scheduled cesarean section.  No acute concerns.   Prenatal Course Source of Care: [facility]  with onset of care at 6 weeks Pregnancy complications or risks: Patient Active Problem List   Diagnosis Date Noted   Supervision of high risk pregnancy in third trimester 09/25/2023   Bacterial vaginosis 09/19/2023   Uterine contractions during pregnancy 09/18/2023   Decreased fetal movement 07/21/2023   Disseminated intravascular coagulation due to placental abruption 09/06/2022   Preeclampsia, severe, third trimester 09/06/2022   HELLP syndrome (HELLP), third trimester 09/06/2022   Acute blood loss anemia (ABLA) 09/06/2022   Cesarean delivery delivered 09/06/2022   Postpartum hemorrhage associated with low clotting factor 09/06/2022   Abdominal pain affecting pregnancy 09/03/2022   IUFD at 20 weeks or more of gestation 09/03/2022   Supervision of high-risk pregnancy, third trimester 03/16/2022   Maternal varicella, non-immune 03/16/2022   Obesity in pregnancy, antepartum 03/16/2022   Rubella non-immune status, antepartum 03/16/2022   Vitamin D  deficiency 03/27/2021   Closed bicondylar fracture of left tibial plateau 03/26/2021   She plans to breastfeed  Prenatal labs and studies: ABO, Rh: --/--/O POS (08/28 1259) Antibody: NEG (08/28 1259) Rubella:  NON IMMUNE RPR: NON REACTIVE (08/28 1259)  HBsAg:   neg HIV:   neg GBS: positive 1 hr Glucola  131 Genetic screening normal Anatomy US  normal   Past Medical History:  Diagnosis Date   COVID 12/2019   and also 2021 - no symptoms, 2022 was a mild case   Pre-diabetes    Vitamin D  deficiency 03/27/2021    Past Surgical History:  Procedure Laterality Date   CESAREAN SECTION  09/03/2022   Procedure: CESAREAN SECTION;  Surgeon: Verdon Keen, MD;  Location: [facility] ORS;  Service: Obstetrics;;   ORIF TIBIA PLATEAU  Left 03/26/2021   Procedure: OPEN REDUCTION INTERNAL FIXATION (ORIF) TIBIAL PLATEAU;  Surgeon: Celena Sharper, MD;  Location: [facility] OR;  Service: Orthopedics;  Laterality: Left;   WISDOM TOOTH EXTRACTION      OB History  Gravida Para Term Preterm AB Living  2 1  1     SAB IAB Ectopic Multiple Live Births          # Outcome Date GA Lbr Len/2nd Weight Sex Type Anes PTL Lv  2 Current           1 Preterm 09/03/22 [redacted]w[redacted]d   M CS-LTranv Gen  FD    Social History   Socioeconomic History   Marital status: Single    Spouse name: Not on file   Number of children: Not on file   Years of education: Not on file   Highest education level: Not on file  Occupational History   Not on file  Tobacco Use   Smoking status: Never   Smokeless tobacco: Never  Vaping Use   Vaping status: Not on file  Substance and Sexual Activity   Alcohol use: Not Currently   Drug use: Never   Sexual activity: Yes    Birth control/protection: I.U.D.    Comment: IUD  Other Topics Concern   Not on file  Social History Narrative   Not on file   Social Drivers of Health   Financial Resource Strain: Low Risk  (03/27/2023)   Received from [facility]   Overall Financial Resource Strain (CARDIA)    Difficulty of Paying Living Expenses: Not hard at all  Food  Insecurity: No Food Insecurity (09/25/2023)   Hunger Vital Sign    Worried About Running Out of Food in the Last Year: Never true    Ran Out of Food in the Last Year: Never true  Transportation Needs: No Transportation Needs (09/25/2023)   PRAPARE - Administrator, Civil Service (Medical): No    Lack of Transportation (Non-Medical): No  Physical Activity: Not on file  Stress: Not on file  Social Connections: Not on file    History reviewed. No pertinent family history.  Medications Prior to Admission  Medication Sig Dispense Refill Last Dose/Taking   aspirin EC 81 MG tablet Take 81 mg by mouth daily. Swallow whole.   Taking    Prenatal 28-0.8 MG TABS Take 1 tablet by mouth daily. 30 tablet 1 Taking   ferrous sulfate  325 (65 FE) MG tablet Take 325 mg by mouth daily with breakfast.      fluconazole  (DIFLUCAN ) 150 MG tablet Take 1 tablet (150 mg total) by mouth daily. 1 tablet 0    fluconazole  (DIFLUCAN ) 150 MG tablet Take 1 tablet (150 mg total) by mouth every third day for 2 doses. May repeat in 1 week. 2 tablet 0    Prenatal 27-1 MG TABS Take 1 tablet by mouth daily. 30 tablet 6 Not Taking    Allergies  Allergen Reactions   Coconut (Cocos Nucifera) Swelling    Review of Systems: Negative except for what is mentioned in HPI.  Physical Exam: BP 130/88 (BP Location: Left Arm)   Pulse 90   Temp 98.4 F (36.9 C) (Oral)   Resp 18   Ht 5' 1 (1.549 m)   Wt 95.3 kg   LMP 01/01/2022 (Exact Date) Comment: pt just gave birth  BMI 39.68 kg/m  FHR by Doppler: 140 bpm CONSTITUTIONAL: Well-developed, well-nourished female in no acute distress.  NECK: Normal range of motion, supple, no masses SKIN: Skin is warm and dry. No rash noted. Not diaphoretic. No erythema. No pallor.  NEUROLGIC: Alert and oriented to person, place, and time. Normal reflexes, muscle tone coordination.  PSYCHIATRIC: Normal mood and affect. Normal behavior. Normal judgment and thought content. CARDIOVASCULAR: Normal heart rate noted, regular rhythm RESPIRATORY: Effort and breath sounds normal, no problems with respiration noted ABDOMEN: Soft, nontender, nondistended, gravid. Well-healed Pfannenstiel incision. PELVIC: Deferred MUSCULOSKELETAL: Normal range of motion. No edema and no tenderness.    Pertinent Labs/Studies:   Results for orders placed or performed during the hospital encounter of 09/24/23 (from the past 72 hours)  CBC     Status: Abnormal   Collection Time: 09/24/23 12:59 PM  Result Value Ref Range   WBC 11.3 (H) 4.0 - 10.5 K/uL   RBC 3.93 3.87 - 5.11 MIL/uL   Hemoglobin 10.7 (L) 12.0 - 15.0 g/dL   HCT 67.0 (L) 63.9 - 53.9 %    MCV 83.7 80.0 - 100.0 fL   MCH 27.2 26.0 - 34.0 pg   MCHC 32.5 30.0 - 36.0 g/dL   RDW 85.3 88.4 - 84.4 %   Platelets 202 150 - 400 K/uL   nRBC 0.0 0.0 - 0.2 %    Comment: Performed at [facility], [address] [place], [place] [address]  RPR     Status: None   Collection Time: 09/24/23 12:59 PM  Result Value Ref Range   RPR Ser Ql NON REACTIVE NON REACTIVE    Comment: Performed at [facility], [address], [place], [place] [address]  Type and screen [facility]     Status: None   Collection Time: 09/24/23 12:59 PM  Result Value Ref Range   ABO/RH(D) O POS    Antibody Screen NEG    Sample Expiration 09/27/2023,2359    Extend sample reason      PREGNANT WITHIN 3 MONTHS, UNABLE TO EXTEND Performed at [facility], [address] [place], [place] [address]     Assessment and Plan :Savannah Hoffman is a 28 y.o. G2P0100 at [redacted]w[redacted]d  being admitted for scheduled cesarean section. The risks of cesarean section discussed with the patient included but were not limited to: bleeding which may require transfusion or reoperation; infection which may require antibiotics; injury to bowel, bladder, ureters or other surrounding organs; injury to the fetus; need for additional procedures including hysterectomy in the event of a life-threatening hemorrhage; placental abnormalities wth subsequent pregnancies, incisional problems, thromboembolic phenomenon and other postoperative/anesthesia complications. The patient concurred with the proposed plan, giving informed written consent for the procedure. Patient has been NPO since last night she will remain NPO for procedure. Anesthesia and OR aware. Preoperative prophylactic antibiotics and SCDs ordered on call to the OR. To OR when ready.    Heather Penton, MD, MPH, FACOG

## 2023-09-23 NOTE — Patient Instructions (Addendum)
 Your procedure is scheduled on: FRIDAY 09/25/23  Arrival Time: Please call Labor and Delivery the day before your scheduled C-Section to find out your arrival time. [phone].  Arrival: If your arrival time is prior to 6:00 am, please enter through the Emergency Room Entrance and you will be directed to Labor and Delivery. If your arrival time is 6:00 am or later, please enter the Medical Mall and follow the greeter's instructions.   REMEMBER: Instructions that are not followed completely may result in serious medical risk, up to and including death; or upon the discretion of your surgeon and anesthesiologist your surgery may need to be rescheduled.  Do not eat food after midnight the night before surgery.  No gum chewing or hard candies.  You may however, drink CLEAR liquids up to 2 hours before you are scheduled to arrive for your surgery. Do not drink anything within 2 hours of your scheduled arrival time.  Clear liquids include: - water  - apple juice without pulp - gatorade (not RED colors) - black coffee or tea (Do NOT add milk or creamers to the coffee or tea) Do NOT drink anything that is not on this list.  One week prior to surgery: Stop Anti-inflammatories (NSAIDS) such as Advil , Aleve, Ibuprofen , Motrin , Naproxen, Naprosyn and Aspirin based products such as Excedrin, Goody's Powder, BC Powder.  Stop ANY OVER THE COUNTER supplements until after surgery.  You may however, continue to take Tylenol  if needed for pain up until the day of surgery.  Continue taking all of your other prescription medications up until the day of surgery.  ON THE DAY OF SURGERY ONLY TAKE THESE MEDICATIONS WITH SIPS OF WATER:  NONE  Use inhalers on the day of surgery and bring to the hospital.  No Alcohol for 24 hours before or after surgery.  No Smoking including e-cigarettes for 24 hours prior to surgery.  No chewable tobacco products for at least 6 hours prior to surgery.  No nicotine  patches on the day of surgery.  Do not use any recreational drugs for at least a week prior to your surgery.  Please be advised that the combination of cocaine and anesthesia may have negative outcomes, up to and including death. If you test positive for cocaine, your surgery will be cancelled.  On the morning of surgery brush your teeth with toothpaste and water, you may rinse your mouth with mouthwash if you wish. Do not swallow any toothpaste or mouthwash.  Use CHG wipes as directed on instruction sheet.  Do not wear jewelry, make-up, hairpins, clips or nail polish.  For welded (permanent) jewelry: bracelets, anklets, waist bands, etc.  Please have this removed prior to surgery.  If it is not removed, there is a chance that hospital personnel will need to cut it off on the day of surgery.  Do not wear lotions, powders, or perfumes.   Do not shave body hair from the neck down 48 hours before surgery.  Contact lenses, hearing aids and dentures may not be worn into surgery.  Do not bring valuables to the hospital. [facility] is not responsible for any missing/lost belongings or valuables.   Bring your C-PAP to the hospital with you in case you may have to spend the night.   Notify your doctor if there is any change in your medical condition (cold, fever, infection).  Wear comfortable clothing (specific to your surgery type) to the hospital.  After surgery, you can help prevent lung complications by doing  breathing exercises.  Take deep breaths and cough every 1-2 hours. Your doctor may order a device called an Incentive Spirometer to help you take deep breaths. When coughing or sneezing, hold a pillow firmly against your incision with both hands. This is called "splinting." Doing this helps protect your incision. It also decreases belly discomfort.  Please call the Pre-admissions Testing Dept. at [phone] if you have any questions about these instructions.  Surgery  Visitation Policy:  Visitor Passes   All visitors, including children, need an identification sticker when visiting. These stickers must be worn where they can be seen.   Labor & Delivery  Laboring women may have one designated support person and two other visitors of any age visit. The support person must remain the same. The visitors may switch with other visitors. Visitation is permitted 24 hours per day. The designated support person or a visitor over the age of 16 may sleep overnight in the patient's room. A doula registered with [facility] for labor and delivery support is not considered a visitor. Doulas not registered with [facility] are considered visitors.  Mother Baby Unit, OB Specialty and Gynecological Care  A designated support person and three visitors of any age may visit. The three visitors may switch out. The designated support person or a visitor age 60 or older may stay overnight in the room. During the postpartum period (up to 6 weeks), if the mother is the patient, she can have her newborn stay with her if there is another support person present who can be responsible for the baby.                                                                                                              Preparing for Surgery with CHLORHEXIDINE  GLUCONATE (CHG) Soap  Chlorhexidine  Gluconate (CHG) Soap  o An antiseptic cleaner that kills germs and bonds with the skin to continue killing germs even after washing  o Used for showering the night before surgery and morning of surgery  Before surgery, you can play an important role by reducing the number of germs on your skin.  CHG (Chlorhexidine  gluconate) soap is an antiseptic cleanser which kills germs and bonds with the skin to continue killing germs even after washing.  Please do not use if you have an allergy to CHG or antibacterial soaps. If your skin becomes reddened/irritated stop using the CHG.  1. Shower the NIGHT  BEFORE SURGERY and the MORNING OF SURGERY with CHG soap.  2. If you choose to wash your hair, wash your hair first as usual with your normal shampoo.  3. After shampooing, rinse your hair and body thoroughly to remove the shampoo.  4. Use CHG as you would any other liquid soap. You can apply CHG directly to the skin and wash gently with a scrungie or a clean washcloth.  5. Apply the CHG soap to your body only from the neck down. Do not use on open wounds or open sores. Avoid contact with your eyes, ears,  mouth, and genitals (private parts). Wash face and genitals (private parts) with your normal soap.  6. Wash thoroughly, paying special attention to the area where your surgery will be performed.  7. Thoroughly rinse your body with warm water.  8. Do not shower/wash with your normal soap after using and rinsing off the CHG soap.  9. Pat yourself dry with a clean towel.  10. Wear clean pajamas to bed the night before surgery.  12. Place clean sheets on your bed the night of your first shower and do not sleep with pets.  13. Shower again with the CHG soap on the day of surgery prior to arriving at the hospital.  14. Do not apply any deodorants/lotions/powders.  15. Please wear clean clothes to the hospital.

## 2023-09-24 ENCOUNTER — Encounter
Admission: RE | Admit: 2023-09-24 | Discharge: 2023-09-24 | Disposition: A | Discharge status: Home / Self Care | Source: Ambulatory Visit | Attending: Obstetrics and Gynecology | Admitting: Obstetrics and Gynecology

## 2023-09-24 DIAGNOSIS — Z01812 Encounter for preprocedural laboratory examination: Secondary | ICD-10-CM | POA: Insufficient documentation

## 2023-09-24 DIAGNOSIS — O0993 Supervision of high risk pregnancy, unspecified, third trimester: Secondary | ICD-10-CM | POA: Insufficient documentation

## 2023-09-24 LAB — CBC
HCT: 32.9 % — ABNORMAL LOW (ref 36.0–46.0)
Hemoglobin: 10.7 g/dL — ABNORMAL LOW (ref 12.0–15.0)
MCH: 27.2 pg (ref 26.0–34.0)
MCHC: 32.5 g/dL (ref 30.0–36.0)
MCV: 83.7 fL (ref 80.0–100.0)
Platelets: 202 K/uL (ref 150–400)
RBC: 3.93 MIL/uL (ref 3.87–5.11)
RDW: 14.6 % (ref 11.5–15.5)
WBC: 11.3 K/uL — ABNORMAL HIGH (ref 4.0–10.5)
nRBC: 0 % (ref 0.0–0.2)

## 2023-09-24 LAB — TYPE AND SCREEN
ABO/RH(D): O POS
Antibody Screen: NEGATIVE
Extend sample reason: UNDETERMINED

## 2023-09-25 ENCOUNTER — Encounter
Admission: RE | Disposition: A | Discharge status: Home / Self Care | Payer: Self-pay | Source: Home / Self Care | Attending: Obstetrics

## 2023-09-25 ENCOUNTER — Other Ambulatory Visit: Payer: Self-pay

## 2023-09-25 ENCOUNTER — Inpatient Hospital Stay
Admission: RE | Admit: 2023-09-25 | Discharge: 2023-09-27 | DRG: 787 | Disposition: A | Discharge status: Home / Self Care

## 2023-09-25 ENCOUNTER — Encounter: Payer: Self-pay | Admitting: Obstetrics and Gynecology

## 2023-09-25 ENCOUNTER — Inpatient Hospital Stay: Payer: Self-pay

## 2023-09-25 ENCOUNTER — Inpatient Hospital Stay: Payer: Self-pay | Admitting: Urgent Care

## 2023-09-25 DIAGNOSIS — E559 Vitamin D deficiency, unspecified: Secondary | ICD-10-CM | POA: Diagnosis present

## 2023-09-25 DIAGNOSIS — Z7982 Long term (current) use of aspirin: Secondary | ICD-10-CM

## 2023-09-25 DIAGNOSIS — O34219 Maternal care for unspecified type scar from previous cesarean delivery: Secondary | ICD-10-CM | POA: Diagnosis present

## 2023-09-25 DIAGNOSIS — O99214 Obesity complicating childbirth: Secondary | ICD-10-CM | POA: Diagnosis present

## 2023-09-25 DIAGNOSIS — D62 Acute posthemorrhagic anemia: Secondary | ICD-10-CM | POA: Diagnosis not present

## 2023-09-25 DIAGNOSIS — O9081 Anemia of the puerperium: Secondary | ICD-10-CM | POA: Diagnosis not present

## 2023-09-25 DIAGNOSIS — O09899 Supervision of other high risk pregnancies, unspecified trimester: Secondary | ICD-10-CM

## 2023-09-25 DIAGNOSIS — Z8616 Personal history of COVID-19: Secondary | ICD-10-CM

## 2023-09-25 DIAGNOSIS — O99013 Anemia complicating pregnancy, third trimester: Secondary | ICD-10-CM | POA: Diagnosis present

## 2023-09-25 DIAGNOSIS — O99891 Other specified diseases and conditions complicating pregnancy: Secondary | ICD-10-CM

## 2023-09-25 DIAGNOSIS — Z2839 Other underimmunization status: Secondary | ICD-10-CM

## 2023-09-25 DIAGNOSIS — O34211 Maternal care for low transverse scar from previous cesarean delivery: Secondary | ICD-10-CM | POA: Diagnosis present

## 2023-09-25 DIAGNOSIS — O0993 Supervision of high risk pregnancy, unspecified, third trimester: Principal | ICD-10-CM

## 2023-09-25 DIAGNOSIS — Z3A37 37 weeks gestation of pregnancy: Secondary | ICD-10-CM | POA: Diagnosis not present

## 2023-09-25 DIAGNOSIS — O99824 Streptococcus B carrier state complicating childbirth: Secondary | ICD-10-CM | POA: Diagnosis present

## 2023-09-25 DIAGNOSIS — Z8759 Personal history of other complications of pregnancy, childbirth and the puerperium: Secondary | ICD-10-CM

## 2023-09-25 DIAGNOSIS — O99210 Obesity complicating pregnancy, unspecified trimester: Secondary | ICD-10-CM | POA: Diagnosis present

## 2023-09-25 LAB — RPR: RPR Ser Ql: NONREACTIVE

## 2023-09-25 SURGERY — Surgical Case
Anesthesia: Spinal | Site: Abdomen

## 2023-09-25 MED ORDER — ONDANSETRON HCL 4 MG/2ML IJ SOLN
INTRAMUSCULAR | Status: AC
  Filled 2023-09-25: qty 2

## 2023-09-25 MED ORDER — GABAPENTIN 300 MG PO CAPS
300.0000 mg | ORAL_CAPSULE | Freq: Every day | ORAL | Status: DC
  Administered 2023-09-25 – 2023-09-26 (×2): 300 mg via ORAL
  Filled 2023-09-25 (×2): qty 1

## 2023-09-25 MED ORDER — OXYCODONE HCL 5 MG PO TABS: 5.0000 mg | ORAL_TABLET | Freq: Once | ORAL | Status: DC | PRN

## 2023-09-25 MED ORDER — SOD CITRATE-CITRIC ACID 500-334 MG/5ML PO SOLN
ORAL | Status: AC
  Filled 2023-09-25: qty 15

## 2023-09-25 MED ORDER — DIPHENHYDRAMINE HCL 50 MG/ML IJ SOLN: 12.5000 mg | INTRAMUSCULAR | Status: DC | PRN

## 2023-09-25 MED ORDER — PHENYLEPHRINE HCL-NACL 20-0.9 MG/250ML-% IV SOLN
INTRAVENOUS | Status: AC
Start: 2023-09-25 — End: 2023-09-25
  Filled 2023-09-25: qty 250

## 2023-09-25 MED ORDER — MORPHINE SULFATE (PF) 0.5 MG/ML IJ SOLN
INTRAMUSCULAR | Status: DC | PRN
  Administered 2023-09-25: .1 mg via EPIDURAL

## 2023-09-25 MED ORDER — CEFAZOLIN SODIUM-DEXTROSE 2-4 GM/100ML-% IV SOLN
2.0000 g | INTRAVENOUS | Status: AC
Start: 2023-09-25 — End: 2023-09-25
  Administered 2023-09-25: 2 g via INTRAVENOUS
  Filled 2023-09-25: qty 100

## 2023-09-25 MED ORDER — LACTATED RINGERS IV BOLUS: 500.0000 mL | Freq: Once | INTRAVENOUS | Status: AC

## 2023-09-25 MED ORDER — POVIDONE-IODINE 10 % EX SWAB: 2.0000 | Freq: Once | CUTANEOUS | Status: DC

## 2023-09-25 MED ORDER — ACETAMINOPHEN 325 MG PO TABS: 650.0000 mg | ORAL_TABLET | Freq: Four times a day (QID) | ORAL | Status: AC

## 2023-09-25 MED ORDER — FERROUS SULFATE 325 (65 FE) MG PO TABS
325.0000 mg | ORAL_TABLET | Freq: Two times a day (BID) | ORAL | Status: DC
  Administered 2023-09-25 – 2023-09-27 (×4): 325 mg via ORAL
  Filled 2023-09-25 (×4): qty 1

## 2023-09-25 MED ORDER — OXYCODONE HCL 5 MG PO TABS
5.0000 mg | ORAL_TABLET | ORAL | Status: AC | PRN
  Administered 2023-09-26: 5 mg via ORAL
  Filled 2023-09-25: qty 1

## 2023-09-25 MED ORDER — OXYTOCIN-SODIUM CHLORIDE 30-0.9 UT/500ML-% IV SOLN: 2.5000 [IU]/h | INTRAVENOUS | Status: AC

## 2023-09-25 MED ORDER — OXYTOCIN-SODIUM CHLORIDE 30-0.9 UT/500ML-% IV SOLN
INTRAVENOUS | Status: AC
  Filled 2023-09-25: qty 500

## 2023-09-25 MED ORDER — FLEET ENEMA RE ENEM: 1.0000 | ENEMA | Freq: Every day | RECTAL | Status: DC | PRN

## 2023-09-25 MED ORDER — SIMETHICONE 80 MG PO CHEW: 80.0000 mg | CHEWABLE_TABLET | ORAL | Status: DC | PRN

## 2023-09-25 MED ORDER — KETOROLAC TROMETHAMINE 30 MG/ML IJ SOLN
30.0000 mg | Freq: Four times a day (QID) | INTRAMUSCULAR | Status: AC
  Administered 2023-09-26: 30 mg via INTRAVENOUS
  Filled 2023-09-25 (×2): qty 1

## 2023-09-25 MED ORDER — ONDANSETRON HCL 4 MG/2ML IJ SOLN
INTRAMUSCULAR | Status: DC | PRN
  Administered 2023-09-25: 4 mg via INTRAVENOUS

## 2023-09-25 MED ORDER — FENTANYL CITRATE (PF) 100 MCG/2ML IJ SOLN
INTRAMUSCULAR | Status: DC | PRN
  Administered 2023-09-25: 15 ug via INTRATHECAL

## 2023-09-25 MED ORDER — MEPERIDINE HCL 25 MG/ML IJ SOLN: 6.2500 mg | INTRAMUSCULAR | Status: DC | PRN

## 2023-09-25 MED ORDER — SIMETHICONE 80 MG PO CHEW
80.0000 mg | CHEWABLE_TABLET | Freq: Three times a day (TID) | ORAL | Status: DC
  Administered 2023-09-25 – 2023-09-26 (×4): 80 mg via ORAL
  Filled 2023-09-25 (×5): qty 1

## 2023-09-25 MED ORDER — BUPIVACAINE HCL (PF) 0.25 % IJ SOLN
INTRAMUSCULAR | Status: AC
  Filled 2023-09-25: qty 30

## 2023-09-25 MED ORDER — PRENATAL MULTIVITAMIN CH
1.0000 | ORAL_TABLET | Freq: Every day | ORAL | Status: DC
  Administered 2023-09-25 – 2023-09-27 (×3): 1 via ORAL
  Filled 2023-09-25 (×3): qty 1

## 2023-09-25 MED ORDER — KETOROLAC TROMETHAMINE 30 MG/ML IJ SOLN: 30.0000 mg | Freq: Four times a day (QID) | INTRAMUSCULAR | Status: AC

## 2023-09-25 MED ORDER — KETOROLAC TROMETHAMINE 30 MG/ML IJ SOLN
INTRAMUSCULAR | Status: DC | PRN
  Administered 2023-09-25: 30 mg via INTRAVENOUS

## 2023-09-25 MED ORDER — LACTATED RINGERS IV SOLN: Freq: Once | INTRAVENOUS | Status: AC

## 2023-09-25 MED ORDER — SODIUM CHLORIDE 0.9% FLUSH: 3.0000 mL | INTRAVENOUS | Status: DC | PRN

## 2023-09-25 MED ORDER — SODIUM CHLORIDE 0.9 % IV SOLN
INTRAVENOUS | Status: AC
  Filled 2023-09-25: qty 5

## 2023-09-25 MED ORDER — SENNOSIDES-DOCUSATE SODIUM 8.6-50 MG PO TABS
2.0000 | ORAL_TABLET | ORAL | Status: DC
  Administered 2023-09-25 – 2023-09-26 (×2): 2 via ORAL
  Filled 2023-09-25 (×2): qty 2

## 2023-09-25 MED ORDER — ORAL CARE MOUTH RINSE: 15.0000 mL | Freq: Once | OROMUCOSAL | Status: AC

## 2023-09-25 MED ORDER — FENTANYL CITRATE (PF) 100 MCG/2ML IJ SOLN
INTRAMUSCULAR | Status: AC
  Filled 2023-09-25: qty 2

## 2023-09-25 MED ORDER — DEXAMETHASONE SODIUM PHOSPHATE 10 MG/ML IJ SOLN
INTRAMUSCULAR | Status: AC
Start: 2023-09-25 — End: 2023-09-25
  Filled 2023-09-25: qty 1

## 2023-09-25 MED ORDER — NALOXONE HCL 0.4 MG/ML IJ SOLN: 0.4000 mg | INTRAMUSCULAR | Status: DC | PRN

## 2023-09-25 MED ORDER — LACTATED RINGERS IV SOLN: INTRAVENOUS | Status: DC

## 2023-09-25 MED ORDER — IBUPROFEN 600 MG PO TABS
600.0000 mg | ORAL_TABLET | Freq: Four times a day (QID) | ORAL | Status: DC
  Administered 2023-09-27 (×3): 600 mg via ORAL
  Filled 2023-09-25 (×3): qty 1

## 2023-09-25 MED ORDER — BUPIVACAINE HCL (PF) 0.5 % IJ SOLN
INTRAMUSCULAR | Status: AC
  Filled 2023-09-25: qty 20

## 2023-09-25 MED ORDER — ONDANSETRON HCL 4 MG/2ML IJ SOLN: 4.0000 mg | Freq: Three times a day (TID) | INTRAMUSCULAR | Status: DC | PRN

## 2023-09-25 MED ORDER — DIPHENHYDRAMINE HCL 25 MG PO CAPS
25.0000 mg | ORAL_CAPSULE | Freq: Four times a day (QID) | ORAL | Status: DC | PRN
  Administered 2023-09-25 – 2023-09-26 (×2): 25 mg via ORAL
  Filled 2023-09-25 (×3): qty 1

## 2023-09-25 MED ORDER — BUPIVACAINE HCL (PF) 0.5 % IJ SOLN
INTRAMUSCULAR | Status: AC
  Filled 2023-09-25: qty 60

## 2023-09-25 MED ORDER — MEASLES, MUMPS & RUBELLA VAC IJ SOLR
0.5000 mL | Freq: Once | INTRAMUSCULAR | Status: DC
  Filled 2023-09-25: qty 0.5

## 2023-09-25 MED ORDER — KETOROLAC TROMETHAMINE 30 MG/ML IJ SOLN
30.0000 mg | Freq: Four times a day (QID) | INTRAMUSCULAR | Status: AC
  Administered 2023-09-25 (×2): 30 mg via INTRAVENOUS
  Filled 2023-09-25 (×2): qty 1

## 2023-09-25 MED ORDER — BUPIVACAINE HCL (PF) 0.25 % IJ SOLN
INTRAMUSCULAR | Status: DC | PRN
Start: 2023-09-25 — End: 2023-09-25
  Administered 2023-09-25: 60 mL

## 2023-09-25 MED ORDER — WITCH HAZEL-GLYCERIN EX PADS: 1.0000 | MEDICATED_PAD | CUTANEOUS | Status: DC | PRN

## 2023-09-25 MED ORDER — OXYCODONE HCL 5 MG/5ML PO SOLN
5.0000 mg | Freq: Once | ORAL | Status: DC | PRN
  Filled 2023-09-25: qty 5

## 2023-09-25 MED ORDER — MENTHOL 3 MG MT LOZG
1.0000 | LOZENGE | OROMUCOSAL | Status: DC | PRN
Start: 2023-09-25 — End: 2023-09-27

## 2023-09-25 MED ORDER — FENTANYL CITRATE (PF) 100 MCG/2ML IJ SOLN: 25.0000 ug | INTRAMUSCULAR | Status: DC | PRN

## 2023-09-25 MED ORDER — SOD CITRATE-CITRIC ACID 500-334 MG/5ML PO SOLN
30.0000 mL | ORAL | Status: AC
  Administered 2023-09-25: 30 mL via ORAL

## 2023-09-25 MED ORDER — DIBUCAINE (PERIANAL) 1 % EX OINT: 1.0000 | TOPICAL_OINTMENT | CUTANEOUS | Status: DC | PRN

## 2023-09-25 MED ORDER — MORPHINE SULFATE (PF) 0.5 MG/ML IJ SOLN
INTRAMUSCULAR | Status: AC
  Filled 2023-09-25: qty 10

## 2023-09-25 MED ORDER — CHLORHEXIDINE GLUCONATE 0.12 % MT SOLN
15.0000 mL | Freq: Once | OROMUCOSAL | Status: AC
  Administered 2023-09-25: 15 mL via OROMUCOSAL
  Filled 2023-09-25: qty 15

## 2023-09-25 MED ORDER — BISACODYL 10 MG RE SUPP: 10.0000 mg | Freq: Every day | RECTAL | Status: DC | PRN

## 2023-09-25 MED ORDER — DEXAMETHASONE SODIUM PHOSPHATE 10 MG/ML IJ SOLN
INTRAMUSCULAR | Status: AC
  Filled 2023-09-25: qty 1

## 2023-09-25 MED ORDER — OXYTOCIN-SODIUM CHLORIDE 30-0.9 UT/500ML-% IV SOLN
INTRAVENOUS | Status: DC | PRN
  Administered 2023-09-25: 500 mL via INTRAVENOUS

## 2023-09-25 MED ORDER — LIDOCAINE HCL (PF) 1 % IJ SOLN
INTRAMUSCULAR | Status: DC | PRN
  Administered 2023-09-25: 3 mL via SUBCUTANEOUS

## 2023-09-25 MED ORDER — PHENYLEPHRINE 80 MCG/ML (10ML) SYRINGE FOR IV PUSH (FOR BLOOD PRESSURE SUPPORT)
PREFILLED_SYRINGE | INTRAVENOUS | Status: AC
  Filled 2023-09-25: qty 10

## 2023-09-25 MED ORDER — ACETAMINOPHEN 500 MG PO TABS
1000.0000 mg | ORAL_TABLET | Freq: Four times a day (QID) | ORAL | Status: DC
  Administered 2023-09-25 – 2023-09-27 (×8): 1000 mg via ORAL
  Filled 2023-09-25 (×8): qty 2

## 2023-09-25 MED ORDER — KETOROLAC TROMETHAMINE 30 MG/ML IJ SOLN
INTRAMUSCULAR | Status: AC
Start: 2023-09-25 — End: 2023-09-25
  Filled 2023-09-25: qty 1

## 2023-09-25 MED ORDER — OXYCODONE HCL 5 MG PO TABS: 5.0000 mg | ORAL_TABLET | ORAL | Status: DC | PRN

## 2023-09-25 MED ORDER — LABETALOL HCL 5 MG/ML IV SOLN
INTRAVENOUS | Status: AC
  Filled 2023-09-25: qty 4

## 2023-09-25 MED ORDER — BUPIVACAINE IN DEXTROSE 0.75-8.25 % IT SOLN
INTRATHECAL | Status: DC | PRN
  Administered 2023-09-25: 1.5 mL via INTRATHECAL

## 2023-09-25 MED ORDER — PHENYLEPHRINE HCL-NACL 20-0.9 MG/250ML-% IV SOLN
INTRAVENOUS | Status: DC | PRN
  Administered 2023-09-25: 40 ug/min via INTRAVENOUS

## 2023-09-25 MED ORDER — DIPHENHYDRAMINE HCL 25 MG PO CAPS
25.0000 mg | ORAL_CAPSULE | ORAL | Status: DC | PRN
  Administered 2023-09-25: 25 mg via ORAL

## 2023-09-25 MED ORDER — TETANUS-DIPHTH-ACELL PERTUSSIS 5-2.5-18.5 LF-MCG/0.5 IM SUSY: 0.5000 mL | PREFILLED_SYRINGE | Freq: Once | INTRAMUSCULAR | Status: DC

## 2023-09-25 SURGICAL SUPPLY — 26 items
BARRIER ADHS 3X4 INTERCEED (GAUZE/BANDAGES/DRESSINGS) IMPLANT
BENZOIN TINCTURE PRP APPL 2/3 (GAUZE/BANDAGES/DRESSINGS) IMPLANT
CHLORAPREP W/TINT 26 (MISCELLANEOUS) ×1 IMPLANT
DRESSING TELFA 8X10 (GAUZE/BANDAGES/DRESSINGS) IMPLANT
DRSG OPSITE POSTOP 4X10 (GAUZE/BANDAGES/DRESSINGS) IMPLANT
DRSG TELFA 3X8 NADH STRL (GAUZE/BANDAGES/DRESSINGS) ×1 IMPLANT
ELECTRODE REM PT RTRN 9FT ADLT (ELECTROSURGICAL) ×1 IMPLANT
GAUZE SPONGE 4X4 12PLY STRL (GAUZE/BANDAGES/DRESSINGS) ×1 IMPLANT
GOWN STRL REUS W/ TWL LRG LVL3 (GOWN DISPOSABLE) ×3 IMPLANT
MANIFOLD NEPTUNE II (INSTRUMENTS) ×1 IMPLANT
MAT PREVALON FULL STRYKER (MISCELLANEOUS) ×1 IMPLANT
NDL HYPO 25GX1X1/2 BEV (NEEDLE) ×1 IMPLANT
NEEDLE HYPO 25GX1X1/2 BEV (NEEDLE) ×1 IMPLANT
NS IRRIG 1000ML POUR BTL (IV SOLUTION) ×1 IMPLANT
PACK C SECTION AR (MISCELLANEOUS) ×1 IMPLANT
PAD OB MATERNITY 11 LF (PERSONAL CARE ITEMS) ×1 IMPLANT
PAD PREP OB/GYN DISP 24X41 (PERSONAL CARE ITEMS) ×1 IMPLANT
SCRUB CHG 4% DYNA-HEX 4OZ (MISCELLANEOUS) ×1 IMPLANT
SUT VIC AB 0 CT1 36 (SUTURE) ×2 IMPLANT
SUT VIC AB 0 CTX36XBRD ANBCTRL (SUTURE) ×2 IMPLANT
SUT VIC AB 2-0 SH 27XBRD (SUTURE) ×2 IMPLANT
SUTURE MNCRL 4-0 27XMF (SUTURE) ×1 IMPLANT
SYR 30ML LL (SYRINGE) ×2 IMPLANT
TAPE TRANSPORE STRL 2 31045 (GAUZE/BANDAGES/DRESSINGS) IMPLANT
TRAP FLUID SMOKE EVACUATOR (MISCELLANEOUS) ×1 IMPLANT
WATER STERILE IRR 500ML POUR (IV SOLUTION) ×1 IMPLANT

## 2023-09-25 NOTE — Transfer of Care (Signed)
 Immediate Anesthesia Transfer of Care Note  Patient: Savannah Hoffman  Procedure(s) Performed: CESAREAN DELIVERY (Abdomen)  Patient Location: Mother/Baby  Anesthesia Type:Spinal  Level of Consciousness: awake, alert , and oriented  Airway & Oxygen Therapy: Patient Spontanous Breathing  Post-op Assessment: Report given to RN and Post -op Vital signs reviewed and stable  Post vital signs: Reviewed and stable  Last Vitals:  Vitals Value Taken Time  BP 96/74   Temp    Pulse 84   Resp 15   SpO2 100     Last Pain:  Vitals:   09/25/23 0635  TempSrc: Oral  PainSc:          Complications: No notable events documented.

## 2023-09-25 NOTE — Anesthesia Procedure Notes (Addendum)
 Spinal  Patient location during procedure: OR Start time: 09/25/2023 7:55 AM End time: 09/25/2023 7:59 AM Reason for block: surgical anesthesia Staffing Performed: other anesthesia staff  Anesthesiologist: Piscitello, Fairy POUR, MD Resident/CRNA: Belinda Salm, CRNA Other anesthesia staff: Celinda Lorilee SAUNDERS, RN Performed by: Belinda, Tineshia Becraft, CRNA Authorized by: Leavy Ned, MD   Preanesthetic Checklist Completed: patient identified, IV checked, site marked, risks and benefits discussed, surgical consent, monitors and equipment checked, pre-op evaluation and timeout performed Spinal Block Patient position: sitting Prep: ChloraPrep Patient monitoring: heart rate, cardiac monitor, continuous pulse ox and blood pressure Approach: midline Location: L3-4 Injection technique: single-shot Needle Needle type: Pencan  Needle gauge: 24 G Needle length: 10 cm Assessment Sensory level: T3 Events: CSF return

## 2023-09-25 NOTE — Anesthesia Preprocedure Evaluation (Signed)
 Anesthesia Evaluation  Patient identified by MRN, date of birth, ID band Patient awake    Reviewed: Allergy & Precautions, NPO status , Patient's Chart, lab work & pertinent test results  History of Anesthesia Complications Negative for: history of anesthetic complications  Airway Mallampati: III  TM Distance: >3 FB Neck ROM: full    Dental  (+) Chipped Braces:   Pulmonary neg pulmonary ROS, neg shortness of breath   Pulmonary exam normal        Cardiovascular Exercise Tolerance: Good (-) hypertensionnegative cardio ROS Normal cardiovascular exam     Neuro/Psych    GI/Hepatic negative GI ROS,neg GERD  ,,  Endo/Other    Renal/GU   negative genitourinary   Musculoskeletal   Abdominal   Peds  Hematology negative hematology ROS (+)   Anesthesia Other Findings Past Medical History: 12/2019: COVID     Comment:  and also 2021 - no symptoms, 2022 was a mild case No date: Pre-diabetes 03/27/2021: Vitamin D  deficiency  Past Surgical History: 09/03/2022: CESAREAN SECTION     Comment:  Procedure: CESAREAN SECTION;  Surgeon: Verdon Keen,              MD;  Location: [facility] ORS;  Service: Obstetrics;; 03/26/2021: ORIF TIBIA PLATEAU; Left     Comment:  Procedure: OPEN REDUCTION INTERNAL FIXATION (ORIF)               TIBIAL PLATEAU;  Surgeon: Celena Sharper, MD;  [facility];  Service: Orthopedics;  Laterality: Left; No date: WISDOM TOOTH EXTRACTION  BMI    Body Mass Index: 39.68 kg/m      Reproductive/Obstetrics (+) Pregnancy                              Anesthesia Physical Anesthesia Plan  ASA: 2  Anesthesia Plan: Spinal   Post-op Pain Management:    Induction:   PONV Risk Score and Plan:   Airway Management Planned: Natural Airway and Nasal Cannula  Additional Equipment:   Intra-op Plan:   Post-operative Plan:   Informed Consent: I have reviewed the  patients History and Physical, chart, labs and discussed the procedure including the risks, benefits and alternatives for the proposed anesthesia with the patient or authorized representative who has indicated his/her understanding and acceptance.     Dental Advisory Given  Plan Discussed with: Anesthesiologist, CRNA and Surgeon  Anesthesia Plan Comments: (Patient reports no bleeding problems and no anticoagulant use.  Plan for spinal with backup GA  Patient consented for risks of anesthesia including but not limited to:  - adverse reactions to medications - damage to eyes, teeth, lips or other oral mucosa - nerve damage due to positioning  - risk of bleeding, infection and or nerve damage from spinal that could lead to paralysis - risk of headache or failed spinal - damage to teeth, lips or other oral mucosa - sore throat or hoarseness - damage to heart, brain, nerves, lungs, other parts of body or loss of life  Patient voiced understanding and assent.)        Anesthesia Quick Evaluation

## 2023-09-25 NOTE — Op Note (Signed)
 Cesarean Section Procedure Note  Date of procedure: 09/25/2023   Pre-operative Diagnosis: Intrauterine pregnancy at [redacted]w[redacted]d;  - hx of IUFD, severe PreE - repeat cesarean seciton  Post-operative Diagnosis: same, delivered.  Procedure: Repeat Low Transverse Cesarean Section through Pfannenstiel incision  Surgeon: Heather Penton, MD  Assistant(s):  Bobbette Brunswick, CNM, Edsel Blush, CNM   An experienced assistant was required given the standard of surgical care given the complexity of the case.  This assistant was needed for exposure, dissection, suctioning, retraction, instrument exchange,  CNM assisting with delivery with administration of fundal pressure, and for overall help during the procedure.   Anesthesia: Spinal anesthesia  Anesthesiologist: Piscitello, Fairy POUR, MD Anesthesiologist: Piscitello, Fairy POUR, MD CRNA: Belinda Salm, CRNA Student Nurse Anesthetist: Celinda Lorilee SAUNDERS, RN  Estimated Blood Loss:  610         Drains: Foley         Total IV Fluids:  Urine Output:         Specimens: cord blood for O+ mom         Complications:  None; patient tolerated the procedure well.         Disposition: PACU - hemodynamically stable.         Condition: stable  Findings:  A female infant Luna in cephalic presentation. Amniotic fluid - Clear  Birth weight 3030 g.   APGAR (1 MIN): 8  APGAR (5 MINS): 9  APGAR (10 MINS):     Intact placenta with a three-vessel cord.  Grossly normal uterus, tubes and ovaries bilaterally. Thick omental intraabdominal adhesions were noted.  Indications: prior c/s  Procedure Details  The patient was taken to Operating Room, identified as the correct patient and the procedure verified as C-Section Delivery. A formal Time Out was held with all team members present and in agreement.  After induction of anesthesia, the patient was draped and prepped in the usual sterile manner. A Pfannenstiel skin incision was made and  carried down through the subcutaneous tissue to the fascia. Fascial incision was made and extended transversely with the Mayo scissors. The fascia was separated from the underlying rectus tissue superiorly and inferiorly. The peritoneum was identified and entered bluntly. Peritoneal incision was extended longitudinally. The utero-vesical peritoneal reflection was incised transversely.  A low transverse hysterotomy was made. The fetus was delivered atraumatically. The umbilical cord was clamped x2 and cut and the infant was handed to the awaiting pediatricians. The placenta was removed intact and appeared normal, intact, and with a 3-vessel cord.   The uterus was exteriorized and cleared of all clot and debris. The hysterotomy was closed with running sutures of 0-Vicryl. A second imbricating layer was placed with the same suture. Excellent hemostasis was observed. The peritoneal cavity was cleared of all clots and debris. The uterus was returned to the abdomen. Interceed placed at the hysterotomy.   Gutters and pelvis were evaluated and excellent hemostasis was noted. The peritoneum was softly sewn together in the midline to decrease future adhesions to the rectus fascia.  The fascia was then reapproximated with running sutures of 0 Maxon.  The subcutaneous tissue was reapproximated with running sutures of 0 Vicryl. The skin was reapproximated with Ensorb absorbable staples. 60 of 0.25% bupivicaine was placed in the fascial and skin lines.  Instrument, sponge, and needle counts were correct prior to the abdominal closure and at the conclusion of the case.   The patient tolerated the procedure well and was transferred to the recovery room  in stable condition.   Heather Penton, MD 09/25/2023

## 2023-09-25 NOTE — Discharge Summary (Signed)
 Postpartum Discharge Summary  Patient Name: Savannah Hoffman DOB: 1995/09/12 MRN: [id number]  Date of admission: 09/25/2023 Delivery date:09/25/2023 Delivering provider: VERDON KEEN Date of discharge: 09/27/2023  Primary OB: [facility] OFE:Ejupzwu'd last menstrual period was 01/01/2022 (exact date). EDC Estimated Date of Delivery: 10/15/23 Gestational Age at Delivery: [redacted]w[redacted]d   Admitting diagnosis: High-risk pregnancy in third trimester [O09.93] Supervision of high risk pregnancy in third trimester [O09.93] Intrauterine pregnancy: [redacted]w[redacted]d     Secondary diagnosis:   Principal Problem:   Cesarean delivery delivered Active Problems:   Vitamin D  deficiency   Maternal varicella, non-immune   Obesity in pregnancy, antepartum   Rubella non-immune status, antepartum   Anemia affecting pregnancy in third trimester   History of postpartum depression, currently pregnant   History of pre-eclampsia   Previous cesarean delivery affecting pregnancy   History of IUFD   Discharge Diagnosis: Term Pregnancy Delivered and repeat cesarean delivery      Hospital course: Scheduled C/S   28 y.o. yo G2P1101 at [redacted]w[redacted]d was admitted to the hospital 09/25/2023 for scheduled cesarean section with the following indication:Elective Repeat.Delivery details are as follows:  Membrane Rupture Time/Date: 8:29 AM,09/25/2023  Delivery Method:C-Section, Low Transverse Details of operation can be found in separate operative note.  Patient had a postpartum course complicated by none.  She is ambulating, tolerating a regular diet, passing flatus, and urinating well. Patient is discharged home in stable condition on  09/27/23        Newborn Data: Birth date:09/25/2023 Birth time:8:29 AM Gender:Female Luna Living status:Living Apgars:8 ,9  Weight:3030 g                                              Post partum procedures:IV Venofer  Augmentation:: N/A Complications: None Delivery Type: repeat  cesarean section, low transverse incision Anesthesia: spinal anesthesia Placenta: manual removal To Pathology: No   Prenatal Labs:  ABO, Rh: --/--/O POS (08/28 1259) Antibody: NEG (08/28 1259) Rubella:  NON IMMUNE RPR: NON REACTIVE (08/28 1259)  HBsAg:   neg HIV:   neg GBS: positive 1 hr Glucola  131 Genetic screening normal Anatomy US  normal  Rhophylac:was not indicated MMR: was offered prior to discharge  Varivax vaccine given: was offered prior to discharge  Tdap vaccine: Given prenatally Flu vaccine: Given prenatally RSV vaccine: Not in season  Transfusion:No  Physical exam  Vitals:   09/26/23 0747 09/26/23 1113 09/27/23 0006 09/27/23 0807  BP: 113/73 132/86 113/75 123/78  Pulse: 73 (!) 103 82 79  Resp: 18 18 20 16   Temp: 98 F (36.7 C) 98.1 F (36.7 C) 98.5 F (36.9 C) 98.9 F (37.2 C)  TempSrc: Oral Oral Oral Oral  SpO2: 97% 93% 98% 99%  Weight:      Height:       General: alert, cooperative, and no distress Lochia: appropriate Uterine Fundus: firm Perineum:minimal edema/intact Incision: No significant erythema, Dressing is clean, dry, and intact, covered with occlusive OP site dressing  DVT Evaluation: No evidence of DVT seen on physical exam.  Labs: Lab Results  Component Value Date   WBC 13.9 (H) 09/26/2023   HGB 8.4 (L) 09/26/2023   HCT 25.8 (L) 09/26/2023   MCV 84.3 09/26/2023   PLT 181 09/26/2023      Latest Ref Rng & Units 09/18/2023   10:32 PM  CMP  Glucose 70 - 99 mg/dL 95  BUN 6 - 20 mg/dL 10   Creatinine 9.55 - 1.00 mg/dL 9.53   Sodium 864 - 854 mmol/L 136   Potassium 3.5 - 5.1 mmol/L 3.7   Chloride 98 - 111 mmol/L 108   CO2 22 - 32 mmol/L 20   Calcium  8.9 - 10.3 mg/dL 8.7   Total Protein 6.5 - 8.1 g/dL 6.5   Total Bilirubin 0.0 - 1.2 mg/dL 0.3   Alkaline Phos 38 - 126 U/L 122   AST 15 - 41 U/L 37   ALT 0 - 44 U/L 14    Edinburgh Score:    09/27/2023    8:15 AM  Edinburgh Postnatal Depression Scale Screening Tool  I have  been able to laugh and see the funny side of things. 0  I have looked forward with enjoyment to things. 0  I have blamed myself unnecessarily when things went wrong. 0  I have been anxious or worried for no good reason. 0  I have felt scared or panicky for no good reason. 0  Things have been getting on top of me. 0  I have been so unhappy that I have had difficulty sleeping. 0  I have felt sad or miserable. 0  I have been so unhappy that I have been crying. 0  The thought of harming myself has occurred to me. 0  Edinburgh Postnatal Depression Scale Total 0     Postpartum VTE Prophylaxis  Recommend 6 weeks of prophylactic anticoagulation with LMWH or subcutaneous unfractionated heparin if 1 or more high risk factor is present.  Recommend 14 days of prophylactic anticoagulation with LMWH or subcutaneous unfractionated heparin if 3 or more moderate risk factors are present.   Risk assessment for postpartum VTE and prophylactic treatment: High risk factors: None Moderate risk factors: Cesarean delivery   Postpartum VTE prophylaxis with LMWH not indicated    After visit meds:  Allergies as of 09/27/2023       Reactions   Coconut (cocos Nucifera) Swelling        Medication List     STOP taking these medications    aspirin EC 81 MG tablet   fluconazole  150 MG tablet Commonly known as: DIFLUCAN        TAKE these medications    acetaminophen  500 MG tablet Commonly known as: TYLENOL  Take 2 tablets (1,000 mg total) by mouth every 6 (six) hours.   ferrous sulfate  325 (65 FE) MG tablet Take 1 tablet (325 mg total) by mouth every Monday, Wednesday, and Friday. Start taking on: September 28, 2023 What changed: when to take this   ibuprofen  600 MG tablet Commonly known as: ADVIL  Take 1 tablet (600 mg total) by mouth every 6 (six) hours as needed for mild pain (pain score 1-3), moderate pain (pain score 4-6) or cramping.   oxyCODONE  5 MG immediate release tablet Commonly  known as: Oxy IR/ROXICODONE  Take 1 tablet (5 mg total) by mouth every 4 (four) hours as needed for up to 5 days for moderate pain (pain score 4-6) or severe pain (pain score 7-10).   Prenatal 28-0.8 MG Tabs Take 1 tablet by mouth daily. What changed: Another medication with the same name was removed. Continue taking this medication, and follow the directions you see here.   senna-docusate 8.6-50 MG tablet Commonly known as: Senokot-S Take 2 tablets by mouth daily. Start taking on: September 28, 2023       Discharge home in stable condition Infant Feeding: Bottle and Breast Infant Disposition:home with  mother Discharge instruction: per After Visit Summary and Postpartum booklet. Activity: Advance as tolerated. Pelvic rest for 6 weeks.  Diet: routine diet Anticipated Birth Control:  Contraceptives: IUD Mirena Postpartum Appointment:6 weeks Additional Postpartum F/U: Incision check 2 weeks  Future Appointments:No future appointments. Follow up Visit:  Follow-up Information     Verdon Keen, MD Follow up in 2 week(s).   Specialty: Obstetrics and Gynecology Why: For wound re-check Contact information: [address] [place] [address] [phone]                 Plan:  Cintya Daughety was discharged to home in good condition. Follow-up appointment as directed.    Signed:  Therisa Pillow, CNM Certified Nurse Midwife [facility]  Clinic OB/GYN [facility]

## 2023-09-25 NOTE — Lactation Note (Signed)
 This note was copied from a baby's chart. Lactation Consultation Note  Patient Name: Savannah Hoffman Unijb'd Date: 09/25/2023 Age:27 hours Reason for consult: Initial assessment;Early term 37-38.6wks;Breastfeeding assistance;RN request   Maternal Data Does the patient have breastfeeding experience prior to this delivery?: No  Initial consult w/ a 6hr old baby Savannah and patient.  This was a repeat c-section delivery.  Patient w/ a hx of preeclampsia w/ severe features, HELLP syndrome, placental abruption w/ postpartum hemorrhage and DIC at 35wks resulting in fetal demise w/ G1.    Patient verbalized that she has a Mom Cozy pump at home.  Feeding Mother's Current Feeding Choice: Breast Milk and Formula   RN and patient attempted putting infant in football hold on the rt breast but had no success.  Infant placed in cradle hold on the left breast.  Mom has short, erect nipples w/ pliable breast tissue.  Infant opens mouth wide and easily grasp breast for a feeding.  Patient does struggle to hold infant in this position and getting comfortable.  LC provided full support during the feeding.  Patient will need lots of encouragement and support w/ breastfeeding.  Very anxious and nervous that her breast are smothering infant.  Provided tips and tricks to help patient feel more comfortable about feeding.    Lots of colostrum pouring out of infants mouth.  Infant placed STS after feeding.  LATCH Score Latch: Grasps breast easily, tongue down, lips flanged, rhythmical sucking.  Audible Swallowing: Spontaneous and intermittent  Type of Nipple: Everted at rest and after stimulation (short erect nipples)  Comfort (Breast/Nipple): Soft / non-tender  Hold (Positioning): Full assist, staff holds infant at breast  LATCH Score: 8  Lactation Tools Discussed/Used Patient provided w/ a Medela Manual Pump.  Education on how to use pump and it was demonstrated on patients breast.  Several drops  of colostrum expressed.   Interventions Interventions: Breast feeding basics reviewed;Assisted with latch;Skin to skin;Breast compression;Adjust position;Support pillows;Position options;Hand pump;Education  LC provided education on the following;  milk production expectations, hunger cues, day 1/2 wet/dirty diapers, benefits of STS and arousing infant for a feeding.  Lactation informed patient of feeding infant at least 8 or more times w/in a 24hr period but not exceeding 3hrs. Patient verbalized understanding.   Discharge Pump: Hands Alasdair Kleve;Personal (Mom cozy) WIC Program: Yes  Consult Status Consult Status: Follow-up Follow-up type: In-patient    Jullisa Grigoryan S Rozlynn Lippold 09/25/2023, 3:15 PM

## 2023-09-26 LAB — CBC
HCT: 26.8 % — ABNORMAL LOW (ref 36.0–46.0)
HCT: 25.8 % — ABNORMAL LOW (ref 36.0–46.0)
Hemoglobin: 8.7 g/dL — ABNORMAL LOW (ref 12.0–15.0)
Hemoglobin: 8.4 g/dL — ABNORMAL LOW (ref 12.0–15.0)
MCH: 27.3 pg (ref 26.0–34.0)
MCH: 27.5 pg (ref 26.0–34.0)
MCHC: 32.5 g/dL (ref 30.0–36.0)
MCHC: 32.6 g/dL (ref 30.0–36.0)
MCV: 84 fL (ref 80.0–100.0)
MCV: 84.3 fL (ref 80.0–100.0)
Platelets: 179 K/uL (ref 150–400)
Platelets: 181 K/uL (ref 150–400)
RBC: 3.19 MIL/uL — ABNORMAL LOW (ref 3.87–5.11)
RBC: 3.06 MIL/uL — ABNORMAL LOW (ref 3.87–5.11)
RDW: 14.6 % (ref 11.5–15.5)
RDW: 14.7 % (ref 11.5–15.5)
WBC: 17 K/uL — ABNORMAL HIGH (ref 4.0–10.5)
WBC: 13.9 K/uL — ABNORMAL HIGH (ref 4.0–10.5)
nRBC: 0 % (ref 0.0–0.2)
nRBC: 0 % (ref 0.0–0.2)

## 2023-09-26 LAB — URINALYSIS, COMPLETE (UACMP) WITH MICROSCOPIC
RBC / HPF: >50 RBC/hpf (ref 0–5)
WBC, UA: >50 WBC/hpf (ref 0–5)

## 2023-09-26 MED ORDER — IBUPROFEN 600 MG PO TABS
ORAL_TABLET | ORAL | Status: AC
  Administered 2023-09-26: 600 mg via ORAL
  Filled 2023-09-26: qty 1

## 2023-09-26 MED ORDER — IBUPROFEN 600 MG PO TABS
ORAL_TABLET | ORAL | Status: AC
  Administered 2023-09-26: 600 mg
  Filled 2023-09-26: qty 1

## 2023-09-26 MED ORDER — SODIUM CHLORIDE 0.9 % IV SOLN
300.0000 mg | Freq: Once | INTRAVENOUS | Status: AC
  Administered 2023-09-26: 300 mg via INTRAVENOUS
  Filled 2023-09-26: qty 15

## 2023-09-26 NOTE — Progress Notes (Signed)
 Post Partum Day 1 Subjective: Doing well, no complaints.  Tolerating regular diet, pain with PO meds, voiding and ambulating without difficulty.  No CP SOB Fever,Chills, N/V or leg pain; denies nipple or breast pain, no HA change of vision, RUQ/epigastric pain  Objective: BP 113/73 (BP Location: Right Arm)   Pulse 73   Temp 98 F (36.7 C) (Oral)   Resp 18   Ht 5' 1 (1.549 m)   Wt 95.3 kg   LMP 01/01/2022 (Exact Date) Comment: pt just gave birth  SpO2 97%   Breastfeeding Unknown   BMI 39.68 kg/m    Vitals:   09/25/23 0950 09/25/23 1000 09/25/23 1015 09/25/23 1045  BP: 122/72 129/78 121/74 (!) 122/90   09/25/23 1100 09/25/23 1130 09/25/23 1206 09/25/23 1314  BP: (!) 154/86 115/87 139/89 120/73   09/25/23 1523 09/25/23 2030 09/26/23 0056 09/26/23 0747  BP: 116/67 122/68 109/68 113/73   Vitals:   09/26/23 0056 09/26/23 0747  BP: 109/68 113/73  Pulse:  73  Resp: 18 18  Temp: 99.6 F (37.6 C) 98 F (36.7 C)  SpO2:  97%    Physical Exam:  General: NAD Breasts: soft/nontender CV: RRR Pulm: nl effort, CTABL Abdomen: soft, NT, BS x 4 Incision: Dsg CDI/occlusive pressure dressing remains in place Lochia: moderate Uterine Fundus: fundus firm and 1 fb below umbilicus DVT Evaluation: no cords, ttp LEs   Recent Labs    09/24/23 1259 09/26/23 0527  HGB 10.7* 8.7*  HCT 32.9* 26.8*  WBC 11.3* 17.0*  PLT 202 179    Assessment/Plan: 28 y.o. G2P1101 postpartum/postoperative day # 1  - Continue routine PP care - Lactation consult PRN - Discussed contraceptive options including implant, IUDs hormonal and non-hormonal, injection, pills/ring/patch, condoms, and NFP.  - Acute blood loss anemia, clinically significant - hemoglobin changed from 10.7 to 8.7, patient is asymptomatic, hemodynamically stable; start po ferrous sulfate  BID with stool softeners, administer Venofer  IV  - Fever: she experienced a low-grade fever overnight, last elevated temperature 2030 at 100.48F. She  reports yesterday she felt hot and was sweating and felt feverish, but has not felt any of those symptoms since last night. Afebrile x12hrs. Her WBC increased 11.3 -> 17.0. Recheck CBC @ 12pm. Abdomen is the expected amount of tender for POD 1, no foul-smelling discharge. Notify CNM if fever returns. Discussed with Dr. IVAR Dinsmore and will add UA.  - Immunization status: Needs varicella and MMR prior to DC  Disposition: Does not desire Dc home today.   Edsel Charlies Blush, CNM 09/26/2023 11:02 AM

## 2023-09-26 NOTE — Anesthesia Post-op Follow-up Note (Signed)
  Anesthesia Pain Follow-up Note  Patient: Savannah Hoffman  Day #: 1  Date of Follow-up: 09/26/2023 Time: 8:44 AM  Last Vitals:  Vitals:   09/26/23 0056 09/26/23 0747  BP: 109/68 113/73  Pulse:  73  Resp: 18 18  Temp: 37.6 C 36.7 C  SpO2:  97%    Level of Consciousness: alert  Pain: none   Side Effects:None  Catheter Site Exam:clean, dry, no drainage     Plan: D/C from anesthesia care at surgeon's request  Debby Mines

## 2023-09-26 NOTE — Anesthesia Postprocedure Evaluation (Signed)
 Anesthesia Post Note  Patient: Savannah Hoffman  Procedure(s) Performed: CESAREAN DELIVERY (Abdomen)  Patient location during evaluation: Mother Baby Anesthesia Type: Spinal Level of consciousness: oriented and awake and alert Pain management: pain level controlled Vital Signs Assessment: post-procedure vital signs reviewed and stable Respiratory status: spontaneous breathing and respiratory function stable Cardiovascular status: blood pressure returned to baseline and stable Postop Assessment: no headache, no backache, no apparent nausea or vomiting and able to ambulate Anesthetic complications: no   No notable events documented.   Last Vitals:  Vitals:   09/26/23 0056 09/26/23 0747  BP: 109/68 113/73  Pulse:  73  Resp: 18 18  Temp: 37.6 C 36.7 C  SpO2:  97%    Last Pain:  Vitals:   09/26/23 0815  TempSrc:   PainSc: 5                  Debby Mines

## 2023-09-26 NOTE — Lactation Note (Signed)
 This note was copied from a baby's chart. Lactation Consultation Note  Patient Name: Savannah Hoffman Date: 09/26/2023 Age:27 hours Reason for consult: 1st time breastfeeding;Breastfeeding assistance;RN request LC to MOB bedside per RN request for assistance with latching at breast.   Maternal Data Does the patient have breastfeeding experience prior to this delivery?: No MOB states she has been trying to latch infant but has not been successful, as she believes flow pattern of bottle nipple appears to be preferred method of intake. She is feeding every 3 hours but infant is not completely alert, and has been demonstrating suckling reflex by feeding during sleep stages when offered formula in a bottle.  Feeding Mother's Current Feeding Choice: Breast Milk and Formula Nipple Type: Slow - flow 37w infant normative behavior observed upon LC room entry, unable to latch at breast due to drowsy state/difficult to rouse despite mutliple attempts. LC Encouraged offering breast as often as possible and maintaining skin-to-skin contact to support feeding cues and bonding. Advised expressing milk as needed and monitoring diaper output to confirm adequate intake. Recommended no more than 3 hours between feeds as 69 week old infants may require tailored supervision to prevent excessive weight loss or risk of blood sugar/bilirubin complications. MOB verbalized understanding and will notify pediatric provider with any changes or concerns. Encouraged contacting outpatient lactation support with any questions regarding breast condition, infant feeding, or adjustments to current feeding plan. LATCH Score Latch: Too sleepy or reluctant, no latch achieved, no sucking elicited.  Audible Swallowing: None  Type of Nipple: Flat  Comfort (Breast/Nipple): Soft / non-tender  Hold (Positioning): Assistance needed to correctly position infant at breast and maintain latch.  LATCH Score: 4  Lactation  Tools Discussed/Used Tools: Nipple Shields Nipple shield size: 20;24 (Multiple sizes of shield provided to MOB to attempt latching infant to breast for longer intervals.) Review diaper output changes, feeding goals and plan, supplementation of expression when shield is utilized, engorgement prevention and management, and mastitis protocol prior to dyad discharging home. Refer to outpatient clinic for additional follow up PRN any time upon leaving hospital.  Interventions Interventions: Breast feeding basics reviewed;Assisted with latch;Skin to skin;Adjust position;DEBP;Pace feeding;Infant Driven Feeding Algorithm education;CDC milk storage guidelines;CDC Guidelines for Breast Pump Cleaning;LPT handout/interventions Per GA at birth, LC reiterated current feeding plan of: offering the breast at the onset of hunger cues, with feeds limited to a maximum of 15 minutes at breast with utilization of shield to encourage infant to latch. If no cues are observed, breastfeeding is offered every 3 hours. Following each breastfeeding session, patient will pump using a hospital-grade DEBP pump for 15 minutes every 3 hours. Supplementation will be provided as needed, with volume guided by day of life (DOL) norms. Discharge Discharge Education: Warning signs for feeding baby;Engorgement and breast care;Outpatient recommendation WIC Program: Yes LC fax [facility] referral to [facility] office per [facility] request. MOB understands holiday hours may impact office appointment, advised to contact [facility] or outpatient clinic if any change occur following upcoming discharge.  Consult Status Consult Status: Follow-up Date: 09/26/23 Follow-up type: In-patient    Savannah Hoffman 09/26/2023, 1:56 PM

## 2023-09-27 ENCOUNTER — Encounter: Payer: Self-pay | Admitting: Obstetrics and Gynecology

## 2023-09-27 DIAGNOSIS — Z8759 Personal history of other complications of pregnancy, childbirth and the puerperium: Secondary | ICD-10-CM

## 2023-09-27 MED ORDER — FERROUS SULFATE 325 (65 FE) MG PO TABS: 325.0000 mg | ORAL_TABLET | ORAL | 1 refills | Status: AC

## 2023-09-27 MED ORDER — IBUPROFEN 600 MG PO TABS: 600.0000 mg | ORAL_TABLET | Freq: Four times a day (QID) | ORAL | 1 refills | Status: AC | PRN

## 2023-09-27 MED ORDER — OXYCODONE HCL 5 MG PO TABS: 5.0000 mg | ORAL_TABLET | ORAL | 0 refills | Status: AC | PRN

## 2023-09-27 MED ORDER — PRENATAL 28-0.8 MG PO TABS
1.0000 | ORAL_TABLET | Freq: Every day | ORAL | 2 refills | Status: AC
  Filled 2023-09-27: qty 30, 30d supply, fill #0

## 2023-09-27 MED ORDER — SENNOSIDES-DOCUSATE SODIUM 8.6-50 MG PO TABS: 2.0000 | ORAL_TABLET | ORAL | Status: AC

## 2023-09-27 MED ORDER — VARICELLA VIRUS VACCINE LIVE 1350 PFU/0.5ML IJ SUSR: 0.5000 mL | INTRAMUSCULAR | Status: DC | PRN

## 2023-09-27 MED ORDER — ACETAMINOPHEN 500 MG PO TABS: 1000.0000 mg | ORAL_TABLET | Freq: Four times a day (QID) | ORAL | Status: AC

## 2023-09-27 NOTE — Discharge Instructions (Signed)
 Cesarean Delivery, Care After Refer to this sheet in the next few weeks. These instructions provide you with information on caring for yourself after your procedure. Your health care provider may also give you specific instructions. Your treatment has been planned according to current medical practices, but problems sometimes occur. Call your health care provider if you have any problems or questions after you go home. HOME CARE INSTRUCTIONS  Please leave honey comb dressing (OP Site) on for 7 days.  You may shower during this period but turn your back to the water so that the dressing does not get directly saturated by the water.   You may take the dressing off on day 7.  The easiest way to do it is in the shower.  Allow the water to run over the dressing and it usually comes off easier.   Only take over-the-counter or prescription medications as directed by your health care provider. Do not drink alcohol, especially if you are breastfeeding or taking medication to relieve pain. Do not  smoke tobacco. Continue to use good perineal care. Good perineal care includes: Wiping your perineum from front to back. Keeping your perineum clean. Check your surgical cut (incision) daily for increased redness, drainage, swelling, or separation of skin. Shower and clean your incision gently with soap and water every day, by letting warm and soapy water run over the incision, and then pat it dry. If your health care provider says it is okay, leave the incision uncovered. Use a bandage (dressing) if the incision is draining fluid or appears irritated. If the adhesive strips across the incision do not fall off within 7 days, carefully peel them off, after a shower. Hug a pillow when coughing or sneezing until your incision is healed. This helps to relieve pain. Do not use tampons, douches or have sexual intercourse, until your health care provider says it is okay. Wear a well-fitting bra that provides breast  support. Limit wearing support panties or control-top hose. Drink enough fluids to keep your urine clear or pale yellow. Eat high-fiber foods such as whole grain cereals and breads, brown rice, beans, and fresh fruits and vegetables every day. These foods may help prevent or relieve constipation. Resume activities such as climbing stairs, driving, lifting, exercising, or traveling as directed by your health care provider. Try to have someone help you with your household activities and your newborn for at least a few days after you leave the hospital. Rest as much as possible. Try to rest or take a nap when your newborn is sleeping. Increase your activities gradually. Do not lift more than 15lbs until directed by a provider. Keep all of your scheduled postpartum appointments. It is very important to keep your scheduled follow-up appointments. At these appointments, your health care provider will be checking to make sure that you are healing physically and emotionally. SEEK MEDICAL CARE IF:  You are passing large clots from your vagina. Save any clots to show your health care provider. You have a foul smelling discharge from your vagina. You have trouble urinating. You are urinating frequently. You have pain when you urinate. You have a change in your bowel movements. You have increasing redness, pain, or swelling near your incision. You have pus draining from your incision. Your incision is separating. You have painful, hard, or reddened breasts. You have a severe headache. You have blurred vision or see spots. You feel sad or depressed. You have thoughts of hurting yourself or your newborn. You have questions  about your care, the care of your newborn, or medications. You are dizzy or light-headed. You have a rash. You have pain, redness, or swelling at the site of the removed intravenous access (IV) tube. You have nausea or vomiting. You stopped breastfeeding and have not had a  menstrual period within 12 weeks of stopping. You are not breastfeeding and have not had a menstrual period within 12 weeks of delivery. You have a fever. SEEK IMMEDIATE MEDICAL CARE IF: You have persistent pain. You have chest pain. You have shortness of breath. You faint. You have leg pain. You have stomach pain. Your vaginal bleeding saturates 2 or more sanitary pads in 1 hour. MAKE SURE YOU:  Understand these instructions. Will watch your condition. Will get help right away if you are not doing well or get worse. Document Released: 10/05/2001 Document Revised: 05/30/2013 Document Reviewed: 09/10/2011 [facility] Patient Information 2015 [place], [place]. This information is not intended to replace advice given to you by your health care provider. Make sure you discuss any questions you have with your health care provider.

## 2023-09-27 NOTE — Lactation Note (Signed)
 This note was copied from a baby's chart. Lactation Consultation Note  Patient Name: Savannah Hoffman Date: 09/27/2023 Age:27 hours Reason for consult: 1st time breastfeeding;Early term 37-38.6wks;Follow-up assessment;Maternal discharge  LC to MOB room in anticipation of discharging from unit. Maternal Data  MOB expressed concern about WIC hours and formula package in anticipation of return home, plans to pump using MomCozy to combo feed expressed maternal milk alongside supplementation. No feeding observed.  Feeding Mother's Current Feeding Choice: Breast Milk and Formula Nipple Type: Slow - flow    Lactation Tools Discussed/Used  If Pumping:  Discussed pumping frequency per infant cues or every 3 hours and of fifteen minute durational sessions to support lactogenesis and ensure adequate milk for infant supplementation, ideally with DEBP (MOB has hands free unit at home and is leaving unit with manual pump from admissions). CDC milk storage guidelines provided during consult. MOB demonstrated understanding of pump use and storage guidelines. Encouraged consistent pumping to establish and maintain supply. Plan to reassess pumping technique and output during follow-up.  For Suppression: To manage breast discomfort while reducing milk expression, wear a supportive bra around the clock and avoid tight or restrictive clothing. Gradually increase time between pumping or nursing sessions--stretching the interval every few days--and slowly reduce pumping duration. Only express enough milk to ease fullness as to not stimulate your breasts and encourage lactation. Apply a cold compress or ice to your breasts for 5-15 minutes multiple times a day, elevate breast tissue to encourage lymphatic drainage, and consider taking an OTC NSAID such as ibuprofen  to bring down any swelling or inflammation. Watch for signs of blocked ducts or infection--like painful lumps, redness, fever, chills, or  body aches--and reach out to your provider or lactation clinic should any changes occur.  Interventions Interventions: Breast feeding basics reviewed;Education;LC Services brochure;Infant Driven Feeding Algorithm education;LPT handout/interventions;CDC Guidelines for Breast Pump Cleaning;CDC milk storage guidelines Encouraged offering breast as often as possible and maintaining skin-to-skin contact to support feeding cues and bonding. Advised expressing milk as needed and monitoring diaper output to confirm adequate intake. Recommended no more than 3 hours between feeds as 31 week old infants may require tailored supervision to prevent excessive weight loss or risk of blood sugar/bilirubin complications. MOB verbalized understanding and will notify pediatric provider with any changes or concerns. Encouraged contacting outpatient lactation support with any questions regarding breast condition, infant feeding, or adjustments to current feeding plan. Discharge Discharge Education: Engorgement and breast care;Warning signs for feeding baby;Outpatient recommendation;Other (comment) (Community clinic information provided for MOB residence) [facility] Program: Yes  Consult Status Consult Status: Complete Date: 09/27/23 Follow-up type: In-patient    Savannah Hoffman 09/27/2023, 12:35 PM

## 2023-09-28 ENCOUNTER — Other Ambulatory Visit: Payer: Self-pay

## 2023-10-02 ENCOUNTER — Other Ambulatory Visit: Payer: Self-pay

## 2023-10-12 ENCOUNTER — Other Ambulatory Visit: Payer: Self-pay

## 2023-10-13 ENCOUNTER — Other Ambulatory Visit: Payer: Self-pay

## 2023-10-13 MED ORDER — FERROUS SULFATE 325 (65 FE) MG PO TABS
325.0000 mg | ORAL_TABLET | ORAL | 1 refills | Status: AC
  Filled 2023-10-13: qty 36, 84d supply, fill #0

## 2023-10-26 ENCOUNTER — Other Ambulatory Visit: Payer: Self-pay

## 2023-11-06 ENCOUNTER — Other Ambulatory Visit: Payer: Self-pay

## 2023-11-06 MED ORDER — METRONIDAZOLE 500 MG PO TABS
500.0000 mg | ORAL_TABLET | Freq: Two times a day (BID) | ORAL | 0 refills | Status: DC
  Filled 2023-11-06: qty 14, 7d supply, fill #0

## 2023-11-06 MED ORDER — FERROUS SULFATE 325 (65 FE) MG PO TABS
325.0000 mg | ORAL_TABLET | ORAL | 1 refills | Status: AC
  Filled 2023-11-06 (×2): qty 90, 210d supply, fill #0

## 2023-11-06 MED ORDER — ESCITALOPRAM OXALATE 10 MG PO TABS
10.0000 mg | ORAL_TABLET | Freq: Every day | ORAL | 1 refills | Status: DC
  Filled 2023-11-06 (×2): qty 90, 90d supply, fill #0

## 2023-12-03 ENCOUNTER — Other Ambulatory Visit: Payer: Self-pay

## 2023-12-03 MED ORDER — METRONIDAZOLE 500 MG PO TABS
500.0000 mg | ORAL_TABLET | Freq: Two times a day (BID) | ORAL | 0 refills | Status: AC
  Filled 2023-12-03: qty 14, 7d supply, fill #0

## 2023-12-11 ENCOUNTER — Other Ambulatory Visit: Payer: Self-pay | Admitting: Orthopedic Surgery

## 2023-12-11 DIAGNOSIS — S82122P Displaced fracture of lateral condyle of left tibia, subsequent encounter for closed fracture with malunion: Secondary | ICD-10-CM

## 2023-12-14 ENCOUNTER — Ambulatory Visit
Admission: RE | Admit: 2023-12-14 | Discharge: 2023-12-14 | Disposition: A | Discharge status: Home / Self Care | Source: Ambulatory Visit | Attending: Orthopedic Surgery | Admitting: Orthopedic Surgery

## 2023-12-14 DIAGNOSIS — S82122P Displaced fracture of lateral condyle of left tibia, subsequent encounter for closed fracture with malunion: Secondary | ICD-10-CM | POA: Insufficient documentation

## 2023-12-15 ENCOUNTER — Other Ambulatory Visit: Payer: Self-pay

## 2023-12-15 MED ORDER — FLUCONAZOLE 150 MG PO TABS
150.0000 mg | ORAL_TABLET | ORAL | 0 refills | Status: AC
  Filled 2023-12-15: qty 2, 3d supply, fill #0

## 2023-12-18 ENCOUNTER — Other Ambulatory Visit: Payer: Self-pay

## 2023-12-18 MED ORDER — FLUCONAZOLE 150 MG PO TABS
150.0000 mg | ORAL_TABLET | ORAL | 3 refills | Status: DC
  Filled 2023-12-18: qty 6, 42d supply, fill #0

## 2023-12-18 MED ORDER — METRONIDAZOLE 0.75 % VA GEL
1.0000 | VAGINAL | 1 refills | Status: AC
  Filled 2023-12-18: qty 70, 49d supply, fill #0

## 2023-12-28 ENCOUNTER — Other Ambulatory Visit: Payer: Self-pay

## 2024-01-11 ENCOUNTER — Other Ambulatory Visit: Payer: Self-pay

## 2024-01-11 MED ORDER — NITROFURANTOIN MONOHYD MACRO 100 MG PO CAPS
100.0000 mg | ORAL_CAPSULE | Freq: Two times a day (BID) | ORAL | 0 refills | Status: AC
  Filled 2024-01-11: qty 10, 5d supply, fill #0

## 2024-01-19 ENCOUNTER — Other Ambulatory Visit: Payer: Self-pay

## 2024-01-19 MED ORDER — FLUCONAZOLE 150 MG PO TABS
150.0000 mg | ORAL_TABLET | Freq: Once | ORAL | 0 refills | Status: AC
  Filled 2024-01-19: qty 2, 2d supply, fill #0

## 2024-02-15 ENCOUNTER — Other Ambulatory Visit: Payer: Self-pay

## 2024-02-15 MED ORDER — FLUCONAZOLE 150 MG PO TABS
150.0000 mg | ORAL_TABLET | Freq: Once | ORAL | 0 refills | Status: AC
  Filled 2024-02-15: qty 1, 1d supply, fill #0

## 2024-02-17 ENCOUNTER — Other Ambulatory Visit: Payer: Self-pay

## 2024-02-17 MED ORDER — FLUCONAZOLE 150 MG PO TABS
150.0000 mg | ORAL_TABLET | ORAL | 0 refills | Status: AC
  Filled 2024-02-17: qty 2, 6d supply, fill #0
